# Patient Record
Sex: Male | Born: 1975 | Race: White | Hispanic: No | Marital: Married | Smoking: Never smoker
Health system: Southern US, Community
[De-identification: ages and names within clinical notes are randomized; demographics above are authoritative.]

## PROBLEM LIST (undated history)

## (undated) DIAGNOSIS — K219 Gastro-esophageal reflux disease without esophagitis: Secondary | ICD-10-CM

## (undated) DIAGNOSIS — F411 Generalized anxiety disorder: Secondary | ICD-10-CM

## (undated) DIAGNOSIS — E291 Testicular hypofunction: Secondary | ICD-10-CM

## (undated) HISTORY — DX: Testicular hypofunction: E29.1

## (undated) HISTORY — PX: WISDOM TOOTH EXTRACTION: SHX21

## (undated) HISTORY — DX: Generalized anxiety disorder: F41.1

---

## 2007-07-13 ENCOUNTER — Encounter: Admission: RE | Admit: 2007-07-13 | Discharge: 2007-07-13 | Payer: Self-pay | Admitting: Internal Medicine

## 2011-02-13 ENCOUNTER — Ambulatory Visit (INDEPENDENT_AMBULATORY_CARE_PROVIDER_SITE_OTHER): Payer: BC Managed Care – PPO | Admitting: Internal Medicine

## 2011-02-13 ENCOUNTER — Other Ambulatory Visit: Payer: Self-pay | Admitting: Internal Medicine

## 2011-02-13 ENCOUNTER — Encounter: Payer: Self-pay | Admitting: Internal Medicine

## 2011-02-13 ENCOUNTER — Other Ambulatory Visit: Payer: Self-pay | Admitting: *Deleted

## 2011-02-13 DIAGNOSIS — G9332 Myalgic encephalomyelitis/chronic fatigue syndrome: Secondary | ICD-10-CM

## 2011-02-13 DIAGNOSIS — F411 Generalized anxiety disorder: Secondary | ICD-10-CM

## 2011-02-13 DIAGNOSIS — E291 Testicular hypofunction: Secondary | ICD-10-CM

## 2011-02-13 DIAGNOSIS — K219 Gastro-esophageal reflux disease without esophagitis: Secondary | ICD-10-CM | POA: Insufficient documentation

## 2011-02-13 DIAGNOSIS — R5382 Chronic fatigue, unspecified: Secondary | ICD-10-CM

## 2011-02-13 DIAGNOSIS — R1013 Epigastric pain: Secondary | ICD-10-CM

## 2011-02-13 DIAGNOSIS — R002 Palpitations: Secondary | ICD-10-CM

## 2011-02-13 DIAGNOSIS — K3189 Other diseases of stomach and duodenum: Secondary | ICD-10-CM

## 2011-02-13 LAB — HEPATIC FUNCTION PANEL
ALT: 23 U/L (ref 0–53)
AST: 22 U/L (ref 0–37)
Albumin: 4.8 g/dL (ref 3.5–5.2)
Alkaline Phosphatase: 46 U/L (ref 39–117)
Bilirubin, Direct: 0 mg/dL (ref 0.0–0.3)
Total Bilirubin: 0.5 mg/dL (ref 0.3–1.2)
Total Protein: 7.9 g/dL (ref 6.0–8.3)

## 2011-02-13 LAB — CBC WITH DIFFERENTIAL/PLATELET
Basophils Absolute: 0 10*3/uL (ref 0.0–0.1)
Basophils Relative: 0.5 % (ref 0.0–3.0)
Eosinophils Absolute: 0.1 10*3/uL (ref 0.0–0.7)
Eosinophils Relative: 2.1 % (ref 0.0–5.0)
HCT: 43.6 % (ref 39.0–52.0)
Hemoglobin: 14.8 g/dL (ref 13.0–17.0)
Lymphocytes Relative: 38.3 % (ref 12.0–46.0)
Lymphs Abs: 2.4 10*3/uL (ref 0.7–4.0)
MCHC: 34 g/dL (ref 30.0–36.0)
MCV: 89.5 fl (ref 78.0–100.0)
Monocytes Absolute: 0.5 10*3/uL (ref 0.1–1.0)
Monocytes Relative: 8 % (ref 3.0–12.0)
Neutro Abs: 3.1 10*3/uL (ref 1.4–7.7)
Neutrophils Relative %: 51.1 % (ref 43.0–77.0)
Platelets: 211 10*3/uL (ref 150.0–400.0)
RBC: 4.87 Mil/uL (ref 4.22–5.81)
RDW: 13 % (ref 11.5–14.6)
WBC: 6.2 10*3/uL (ref 4.5–10.5)

## 2011-02-13 LAB — BASIC METABOLIC PANEL
BUN: 11 mg/dL (ref 6–23)
CO2: 29 mEq/L (ref 19–32)
Calcium: 9.6 mg/dL (ref 8.4–10.5)
Chloride: 103 mEq/L (ref 96–112)
Creatinine, Ser: 0.9 mg/dL (ref 0.4–1.5)
GFR: 98.79 mL/min (ref >60.00)
Glucose, Bld: 87 mg/dL (ref 70–99)
Potassium: 4.2 mEq/L (ref 3.5–5.1)
Sodium: 140 mEq/L (ref 135–145)

## 2011-02-13 LAB — T4, FREE: Free T4: 0.81 ng/dL (ref 0.60–1.60)

## 2011-02-13 LAB — TSH: TSH: 1.78 u[IU]/mL (ref 0.35–5.50)

## 2011-02-13 LAB — SEDIMENTATION RATE: Sed Rate: 6 mm/hr (ref 0–22)

## 2011-02-13 MED ORDER — FLUOXETINE HCL 20 MG PO TABS: 20.0000 mg | ORAL_TABLET | Freq: Every day | ORAL | Status: DC

## 2011-02-13 NOTE — Assessment & Plan Note (Signed)
35 year old with history of generalized anxiety disorder. Rule out underlying metabolic abnormality. Obtain thyroid function studies. Consider check serum free metanephrines

## 2011-02-13 NOTE — Assessment & Plan Note (Signed)
Pt with hx of possible hypogonadism.  Repeat testosterone levels

## 2011-02-13 NOTE — Patient Instructions (Signed)
Please forward a copy of your sleep study to our office.

## 2011-02-13 NOTE — Progress Notes (Signed)
Subjective:    Patient ID: Thomas Duran, male    DOB: Mar 29, 1976, 35 y.o.   MRN: [id number]  HPI  35 year old white male to establish. Patient has not had PCP in the past. He has been followed by Dr. Merla Riches at local urgent care. He has been struggling with anxiety disorder for the last several years. He has tried multiple medications and is currently on citalopram 40 mg once daily. Despite taking his SSRI he still has persistent symptoms. He describes intermittent tachycardia, hot flashes and a fluttering sensation in the stomach.  He does admit to being a worrier growing up. He denies history of serious depression or trauma. Denies history of sexual abuse or physical abuse.  He tried taking alprazolam in the past as needed.  It did not help.  Patient also complains of chronic fatigue since 2005. This has been  worked up by his previous primary care physicians. Patient noted to have mildly low testosterone levels in 2010. He was treated with low-dose testosterone replacement for 4-6 months then discontinued. There was question of possible sleep apnea but sleep study was negative.  He complains of eye lids being heavy all the time despite getting enough sleep. It does not make a difference whether he is at work or on vacation he stills feels the same level of fatigue. There was no preceding viral illness. However he did have some life stressors between 2004 2005. During that year he got married, experienced a change in work environment, and found that his brother was diagnosed with schizophrenia.   Review of Systems    Constitutional: Negative for activity change, appetite change and unexpected weight change.  Eyes: Negative for visual disturbance.  Respiratory: Negative for cough, chest tightness and shortness of breath.   Cardiovascular: Negative for chest pain.  Genitourinary: Negative for difficulty urinating.  Neurological: Negative for headaches.  Gastrointestinal: he complains of  chronic reflux. He also has a fluttering sensation in his stomach, "my stomach can't relax"  Psych: Negative for depression  Endo:  No polyuria or polydypsia  Musculoskeletal: Negative for joint pain or swelling Skin: Negative for rash    Patient denies family history of autoimmune disease premature coronary disease, cancer   Past Medical History  Diagnosis Date  . Generalized anxiety disorder     History   Social History  . Marital Status: Married    Spouse Name: N/A    Number of Children: N/A  . Years of Education: N/A   Occupational History  .      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 Smoking status: Never Smoker   . Smokeless tobacco: Not on file  . Alcohol Use: Yes  . Drug Use: No  . Sexually Active: Not on file   Other Topics Concern  . Not on file   Social History Narrative  . No narrative on file    No past surgical history on file.  Family History  Problem Relation Age of Onset  . Schizophrenia Brother     No Known Allergies  No current outpatient prescriptions on file prior to visit.    BP 122/82  Pulse 80  Temp(Src) 98.4 F (36.9 C) (Oral)  Ht 6' (1.829 m)  Wt 194 lb (87.998 kg)  BMI 26.31 kg/m2      Objective:   Physical Exam   Constitutional: Appears well-developed and well-nourished. No distress.  Head: Normocephalic and atraumatic.  Ear:  Normal bilaterally Mouth/Throat: Oropharynx is  clear and moist.  Eyes: Conjunctivae are normal. Pupils are equal, round, and reactive to light.  Neck: Normal range of motion. Neck supple. No thyromegaly present. No carotid bruit Cardiovascular: Normal rate, regular rhythm and normal heart sounds.  Exam reveals no gallop and no friction rub.  No murmur heard. Pulmonary/Chest: Effort normal and breath sounds normal.  No wheezes. No rales.  Abdominal: Soft. Bowel sounds are normal. No mass. There is no tenderness.  Neurological: Alert. No cranial nerve deficit.    Skin: Skin is warm and dry.  Psychiatric: Normal mood and affect. Behavior is normal.       Assessment & Plan:

## 2011-02-13 NOTE — Assessment & Plan Note (Signed)
The patient describes signs and symptoms consistent with chronic fatigue syndrome for the last 5 years. Rule out metabolic disorder.  We discussed further testing if initial basic workup is negative. Patient reports previously the study was normal  -  obtained copy to rule out periodic limb movement disorder.

## 2011-02-13 NOTE — Assessment & Plan Note (Signed)
Pt with chronic dyspepsia.  Previous EGD in April 2011 reported normal.  Consider GI referral

## 2011-02-14 LAB — ANA: Anti Nuclear Antibody(ANA): NEGATIVE

## 2011-02-15 LAB — TESTOSTERONE, FREE, TOTAL, SHBG
Sex Hormone Binding: 23 nmol/L (ref 13–71)
Testosterone, Free: 44.3 pg/mL — ABNORMAL LOW (ref 47.0–244.0)
Testosterone-% Free: 2.3 % (ref 1.6–2.9)
Testosterone: 192.13 ng/dL — ABNORMAL LOW (ref 250–890)

## 2011-02-19 LAB — LUTEINIZING HORMONE: LH: 2.1 m[IU]/mL (ref 1.5–9.3)

## 2011-02-19 LAB — PROLACTIN: Prolactin: 10.8 ng/mL (ref 2.1–17.1)

## 2011-02-20 NOTE — Progress Notes (Signed)
Addended by: Simeon Craft on: 02/20/2011 09:47 AM   Modules accepted: Orders

## 2011-02-20 NOTE — Assessment & Plan Note (Signed)
Pt with low testosterone level but normal LH.  Prolactin level normal.  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   Refer to endo for further evaluation and treatment.

## 2011-02-21 ENCOUNTER — Other Ambulatory Visit: Payer: Self-pay | Admitting: Internal Medicine

## 2011-02-21 DIAGNOSIS — E291 Testicular hypofunction: Secondary | ICD-10-CM

## 2011-03-11 ENCOUNTER — Encounter: Payer: Self-pay | Admitting: Internal Medicine

## 2011-03-11 ENCOUNTER — Ambulatory Visit (INDEPENDENT_AMBULATORY_CARE_PROVIDER_SITE_OTHER): Payer: BC Managed Care – PPO | Admitting: Internal Medicine

## 2011-03-11 VITALS — BP 122/74 | Temp 99.0°F | Ht 73.0 in | Wt 197.0 lb

## 2011-03-11 DIAGNOSIS — L0202 Furuncle of face: Secondary | ICD-10-CM

## 2011-03-11 DIAGNOSIS — L0203 Carbuncle of face: Secondary | ICD-10-CM

## 2011-03-11 MED ORDER — SULFAMETHOXAZOLE-TMP DS 800-160 MG PO TABS: 1.0000 | ORAL_TABLET | Freq: Two times a day (BID) | ORAL | Status: AC

## 2011-03-11 NOTE — Assessment & Plan Note (Signed)
35 year old with 2-3 cm indurated area of right lower face. He has younger daughter with history of MRSA. Treat with Bactrim DS twice a day x10 days. Patient also advised to use warm compress to 3 times per day. He understands area may need incision and drainage. Reassess in one week.

## 2011-03-11 NOTE — Progress Notes (Signed)
  Subjective:    Patient ID: Thomas Duran, male    DOB: 1975-05-16, 35 y.o.   MRN: [id number]  HPI  35 year old white male complains of right lower facial swelling and redness.  His symptoms started approximately 3 days ago. He has associated mild tenderness. He reports his daughter recently diagnosed with MRSA.  He denies any dental abscess, he was recently seen by his dentist.  Review of Systems Negative for fever,  Negative for oral lesions    Past Medical History  Diagnosis Date  . Generalized anxiety disorder     History   Social History  . Marital Status: Married    Spouse Name: N/A    Number of Children: N/A  . Years of Education: N/A   Occupational History  .      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 Smoking status: Never Smoker   . Smokeless tobacco: Not on file  . Alcohol Use: Yes  . Drug Use: No  . Sexually Active: Not on file   Other Topics Concern  . Not on file   Social History Narrative  . No narrative on file    No past surgical history on file.  Family History  Problem Relation Age of Onset  . Schizophrenia Brother     No Known Allergies  Current Outpatient Prescriptions on File Prior to Visit  Medication Sig Dispense Refill  . esomeprazole (NEXIUM) 40 MG capsule Take 40 mg by mouth daily before breakfast.        . FLUoxetine (PROZAC) 20 MG tablet Take 1 tablet (20 mg total) by mouth daily.  30 tablet  2  . Multiple Vitamin (MULTIVITAMIN) tablet Take 1 tablet by mouth daily.          BP 122/74  Temp(Src) 99 F (37.2 C) (Oral)  Ht 6\' 1"  (1.854 m)  Wt 197 lb (89.359 kg)  BMI 25.99 kg/m2    Objective:   Physical Exam  Constitutional: He appears well-developed and well-nourished.  HENT:  Head: Normocephalic and atraumatic.  Mouth/Throat: Oropharynx is clear and moist.  Neck: Normal range of motion. Neck supple.  Cardiovascular: Normal rate, regular rhythm and normal heart sounds.     Pulmonary/Chest: Effort normal and breath sounds normal. No respiratory distress. He has no wheezes.  Lymphadenopathy:    He has no cervical adenopathy.  Skin:       Right lower facial swelling and redness.  Mild tenderness.  No fluctuance       Assessment & Plan:

## 2011-03-11 NOTE — Patient Instructions (Signed)
Apply warm compress to face 2-3 times per day. Please call our office immediately, if your facial abscess / redness gets worse.

## 2011-03-14 ENCOUNTER — Encounter: Payer: Self-pay | Admitting: Internal Medicine

## 2011-03-14 ENCOUNTER — Ambulatory Visit (INDEPENDENT_AMBULATORY_CARE_PROVIDER_SITE_OTHER): Payer: BC Managed Care – PPO | Admitting: Internal Medicine

## 2011-03-14 VITALS — BP 108/70 | HR 66 | Temp 98.2°F | Wt 197.0 lb

## 2011-03-14 DIAGNOSIS — L0201 Cutaneous abscess of face: Secondary | ICD-10-CM

## 2011-03-14 DIAGNOSIS — L0202 Furuncle of face: Secondary | ICD-10-CM

## 2011-03-14 DIAGNOSIS — L0203 Carbuncle of face: Secondary | ICD-10-CM

## 2011-03-14 DIAGNOSIS — L03211 Cellulitis of face: Secondary | ICD-10-CM

## 2011-03-14 NOTE — Progress Notes (Signed)
  Subjective:    Patient ID: Thomas Duran, male    DOB: Aug 16, 1975, 35 y.o.   MRN: [id number]  HPI  35 year old patient who is seen today in followup. He was seen here 3 days ago and placed on Bactrim DS for a facial cellulitis with furuncle.  His daughter has been treated in the recent past for MRSA.  He continues to have considerable right facial swelling with discomfort there has been no fever or chills. He feels he is unchanged without improvement but has not developed any systemic toxicity symptoms.    Review of Systems  Constitutional: Negative for fever, chills and fatigue.  HENT: Positive for facial swelling.        Objective:   Physical Exam  Constitutional: He is oriented to person, place, and time. He appears well-developed and well-nourished. No distress.       Afebrile. Non-toxic-appearing. Pulse rate 66  HENT:  Head: Normocephalic.  Right Ear: External ear normal.  Left Ear: External ear normal.       Considerable right facial swelling approximately 6-8 cm in diameter; it is firm tender mildly red but not fluctuant; No drainage  Eyes: Conjunctivae and EOM are normal.  Neck: Normal range of motion.  Cardiovascular: Normal rate and normal heart sounds.   Pulmonary/Chest: Breath sounds normal.  Abdominal: Bowel sounds are normal.  Musculoskeletal: Normal range of motion. He exhibits no edema and no tenderness.  Neurological: He is alert and oriented to person, place, and time.  Psychiatric: He has a normal mood and affect. His behavior is normal.          Assessment & Plan:   Persistent facial cellulitis/carbuncle-  will set up ENT evaluation for consideration of I&D

## 2011-03-14 NOTE — Patient Instructions (Addendum)
Warm compresses 4 times daily ENT evaluation tomorrow as scheduled  Take your antibiotic as prescribed until ALL of it is gone, but stop if you develop a rash, swelling, or any side effects of the medication.  Contact our office as soon as possible if  there are side effects of the medication.

## 2011-03-18 ENCOUNTER — Ambulatory Visit: Payer: BC Managed Care – PPO | Admitting: Internal Medicine

## 2011-03-29 ENCOUNTER — Ambulatory Visit (INDEPENDENT_AMBULATORY_CARE_PROVIDER_SITE_OTHER): Payer: BC Managed Care – PPO | Admitting: Internal Medicine

## 2011-03-29 DIAGNOSIS — J069 Acute upper respiratory infection, unspecified: Secondary | ICD-10-CM

## 2011-03-29 DIAGNOSIS — A4902 Methicillin resistant Staphylococcus aureus infection, unspecified site: Secondary | ICD-10-CM | POA: Insufficient documentation

## 2011-03-29 NOTE — Patient Instructions (Signed)
Use neil med sinus over the counter as directed Use Mucinex DM for cough

## 2011-03-29 NOTE — Progress Notes (Signed)
  Subjective:    Patient ID: Thomas Duran, male    DOB: [birth year], 35 y.o.   MRN: [id number]  URI  This is a new problem. The current episode started in the past 7 days. The problem has been unchanged. There has been no fever. Associated symptoms include congestion and coughing. Pertinent negatives include no neck pain, sinus pain or sore throat.    He was seen by ENT for right facial abscess.  Positive culture for MRSA.  He recently finished course of doxycycline.  Review of Systems  HENT: Positive for congestion. Negative for sore throat and neck pain.   Respiratory: Positive for cough.    Past Medical History  Diagnosis Date  . Generalized anxiety disorder     History   Social History  . Marital Status: Married    Spouse Name: N/A    Number of Children: N/A  . Years of Education: N/A   Occupational History  .      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 Smoking status: Never Smoker   . Smokeless tobacco: Not on file  . Alcohol Use: Yes  . Drug Use: No  . Sexually Active: Not on file   Other Topics Concern  . Not on file   Social History Narrative  . No narrative on file    No past surgical history on file.  Family History  Problem Relation Age of Onset  . Schizophrenia Brother     No Known Allergies  Current Outpatient Prescriptions on File Prior to Visit  Medication Sig Dispense Refill  . esomeprazole (NEXIUM) 40 MG capsule Take 40 mg by mouth daily before breakfast.        . FLUoxetine (PROZAC) 20 MG tablet Take 1 tablet (20 mg total) by mouth daily.  30 tablet  2  . Multiple Vitamin (MULTIVITAMIN) tablet Take 1 tablet by mouth daily.          BP 112/82  Temp(Src) 98.2 F (36.8 C) (Oral)  Wt 194 lb (87.998 kg)      Objective:   Physical Exam  Constitutional: He appears well-developed and well-nourished.  HENT:  Head: Normocephalic and atraumatic.  Right Ear: External ear normal.  Left Ear: External ear  normal.  Mouth/Throat: No oropharyngeal exudate.       Mild oropharyngeal erythema, signs of postnasal drip  Cardiovascular: Normal rate, regular rhythm and normal heart sounds.   Pulmonary/Chest: Effort normal and breath sounds normal. No respiratory distress. He has no wheezes. He has no rales.      Assessment & Plan:

## 2011-03-29 NOTE — Assessment & Plan Note (Signed)
Patient was seen by ENT-Dr. Annalee Genta. Cultures confirmed MRSA. He was treated with clindamycin and doxycycline. I&D performed successfully.

## 2011-03-29 NOTE — Assessment & Plan Note (Signed)
Nonproductive cough consistent with a viral URI. He is afebrile and his chest is clear on exam.  I suspect cough from post nasal gtt. We discussed symptomatic treatment.  Patient advised to call office if symptoms persist or worsen.

## 2011-04-22 ENCOUNTER — Inpatient Hospital Stay: Admission: RE | Admit: 2011-04-22 | Payer: BC Managed Care – PPO | Source: Ambulatory Visit

## 2011-04-22 ENCOUNTER — Other Ambulatory Visit: Payer: Self-pay | Admitting: Internal Medicine

## 2011-04-22 DIAGNOSIS — E291 Testicular hypofunction: Secondary | ICD-10-CM

## 2011-04-22 DIAGNOSIS — Z139 Encounter for screening, unspecified: Secondary | ICD-10-CM

## 2011-04-26 ENCOUNTER — Ambulatory Visit
Admission: RE | Admit: 2011-04-26 | Discharge: 2011-04-26 | Disposition: A | Discharge status: Home / Self Care | Payer: BC Managed Care – PPO | Source: Ambulatory Visit | Attending: Internal Medicine | Admitting: Internal Medicine

## 2011-04-26 DIAGNOSIS — E291 Testicular hypofunction: Secondary | ICD-10-CM

## 2011-04-26 DIAGNOSIS — Z139 Encounter for screening, unspecified: Secondary | ICD-10-CM

## 2011-05-14 ENCOUNTER — Other Ambulatory Visit: Payer: Self-pay | Admitting: *Deleted

## 2011-05-14 MED ORDER — FLUOXETINE HCL 20 MG PO TABS: 20.0000 mg | ORAL_TABLET | Freq: Every day | ORAL | Status: DC

## 2011-05-28 ENCOUNTER — Other Ambulatory Visit: Payer: Self-pay | Admitting: Physician Assistant

## 2011-05-28 MED ORDER — ESOMEPRAZOLE MAGNESIUM 40 MG PO CPDR: 40.0000 mg | DELAYED_RELEASE_CAPSULE | Freq: Every day | ORAL | Status: DC

## 2011-08-14 ENCOUNTER — Ambulatory Visit (INDEPENDENT_AMBULATORY_CARE_PROVIDER_SITE_OTHER): Payer: BC Managed Care – PPO | Admitting: Internal Medicine

## 2011-08-14 ENCOUNTER — Encounter: Payer: Self-pay | Admitting: Internal Medicine

## 2011-08-14 VITALS — BP 104/66 | HR 76 | Temp 98.2°F | Ht 73.0 in | Wt 200.0 lb

## 2011-08-14 DIAGNOSIS — L0202 Furuncle of face: Secondary | ICD-10-CM

## 2011-08-14 MED ORDER — SULFAMETHOXAZOLE-TMP DS 800-160 MG PO TABS: 1.0000 | ORAL_TABLET | Freq: Two times a day (BID) | ORAL | Status: AC

## 2011-08-14 NOTE — Patient Instructions (Signed)
Please call our office if your symptoms do not improve or gets worse.  

## 2011-08-14 NOTE — Progress Notes (Signed)
  Subjective:    Patient ID: Thomas Duran, male    DOB: [birth year], 36 y.o.   MRN: [id number]  HPI   36 year old white male with history of MRSA infection of the face presents with small lesion right lower cheek.  Onset 24-48 hours. Patient was previously seen by ENT and  had facial abscess drained.  Both of his children have hx of MRSA skin infection as well.  1 year daughter was first to get MRSA.   Review of Systems Negative for fever or chills   Past Medical History  Diagnosis Date  . Generalized anxiety disorder     History   Social History  . Marital Status: Married    Spouse Name: N/A    Number of Children: N/A  . Years of Education: N/A   Occupational History  .      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 Smoking status: Never Smoker   . Smokeless tobacco: Not on file  . Alcohol Use: Yes  . Drug Use: No  . Sexually Active: Not on file   Other Topics Concern  . Not on file   Social History Narrative  . No narrative on file    No past surgical history on file.  Family History  Problem Relation Age of Onset  . Schizophrenia Brother     No Known Allergies  Current Outpatient Prescriptions on File Prior to Visit  Medication Sig Dispense Refill  . esomeprazole (NEXIUM) 40 MG capsule Take 1 capsule (40 mg total) by mouth daily.  30 capsule  3  . Multiple Vitamin (MULTIVITAMIN) tablet Take 1 tablet by mouth daily.          BP 104/66  Pulse 76  Temp(Src) 98.2 F (36.8 C) (Oral)  Ht 6\' 1"  (1.854 m)  Wt 200 lb (90.719 kg)  BMI 26.39 kg/m2       Objective:   Physical Exam  Constitutional: He appears well-developed and well-nourished.  Cardiovascular: Normal rate, regular rhythm and normal heart sounds.   Pulmonary/Chest: Effort normal and breath sounds normal. He has no wheezes.  Skin:       Small slightly erythematous subcentimeter lesion right lower cheek.      Assessment & Plan:

## 2011-08-14 NOTE — Assessment & Plan Note (Signed)
Previous facial abscess required surgical drainage.  He has new small area on right cheek.  Start Bactrim DS.  Patient advised to call office if symptoms persist or worsen.

## 2011-09-21 ENCOUNTER — Other Ambulatory Visit: Payer: Self-pay | Admitting: Physician Assistant

## 2011-11-08 ENCOUNTER — Ambulatory Visit (INDEPENDENT_AMBULATORY_CARE_PROVIDER_SITE_OTHER): Payer: BC Managed Care – PPO | Admitting: Internal Medicine

## 2011-11-08 ENCOUNTER — Encounter: Payer: Self-pay | Admitting: Internal Medicine

## 2011-11-08 VITALS — BP 116/82 | HR 71 | Temp 98.6°F | Wt 200.0 lb

## 2011-11-08 DIAGNOSIS — R0683 Snoring: Secondary | ICD-10-CM

## 2011-11-08 DIAGNOSIS — R0989 Other specified symptoms and signs involving the circulatory and respiratory systems: Secondary | ICD-10-CM

## 2011-11-08 DIAGNOSIS — E291 Testicular hypofunction: Secondary | ICD-10-CM

## 2011-11-08 DIAGNOSIS — R0609 Other forms of dyspnea: Secondary | ICD-10-CM

## 2011-11-08 DIAGNOSIS — G4733 Obstructive sleep apnea (adult) (pediatric): Secondary | ICD-10-CM | POA: Insufficient documentation

## 2011-11-08 DIAGNOSIS — F411 Generalized anxiety disorder: Secondary | ICD-10-CM

## 2011-11-08 NOTE — Assessment & Plan Note (Signed)
Patient tried to taper off fluoxetine 30 mg in his own. He did well for one to 2 weeks but anxiety symptoms returned. He has resumed 30 mg of fluoxetine. I recommend he continue for the next 6 months or year. If he would like to try tapering off in the future, we discussed lowering dose slowly over several months.

## 2011-11-08 NOTE — Assessment & Plan Note (Signed)
36 year old with loud snoring over last 3 months. Wife concerned of possible obstructive sleep apnea. Arrange in-home sleep study.

## 2011-11-08 NOTE — Patient Instructions (Addendum)
Our office will contact you re: arranging home sleep study

## 2011-11-08 NOTE — Progress Notes (Signed)
  Subjective:    Patient ID: Thomas Duran, male    DOB: [birth year], 36 y.o.   MRN: [id number]  HPI  36 year old white male with history of hypogonadism and chronic fatigue syndrome for followup. Since previous visit he was seen by endocrinologist. He was started on testosterone replacement. He reports testosterone levels have normalized. He is feeling somewhat better.  His wife has noticed increased snoring over last 3 months.  He does experience daytime somnolence.  Generalized anxiety disorder-patient tried to taper off fluoxetine. He was okay for approximately 2 weeks but anxiety symptoms return. Patient resumed 30 mg of fluoxetine.  Review of Systems Negative for chest pain  Past Medical History  Diagnosis Date  . Generalized anxiety disorder     History   Social History  . Marital Status: Married    Spouse Name: N/A    Number of Children: N/A  . Years of Education: N/A   Occupational History  .      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 Smoking status: Never Smoker   . Smokeless tobacco: Not on file  . Alcohol Use: Yes  . Drug Use: No  . Sexually Active: Not on file   Other Topics Concern  . Not on file   Social History Narrative  . No narrative on file    No past surgical history on file.  Family History  Problem Relation Age of Onset  . Schizophrenia Brother     No Known Allergies  Current Outpatient Prescriptions on File Prior to Visit  Medication Sig Dispense Refill  . Multiple Vitamin (MULTIVITAMIN) tablet Take 1 tablet by mouth daily.        Marland Kitchen NEXIUM 40 MG capsule TAKE 1 CAPSULE BY MOUTH DAILY  30 capsule  3  . testosterone cypionate (DEPOTESTOTERONE CYPIONATE) 200 MG/ML injection Inject 200 mg into the muscle every 14 (fourteen) days.        BP 116/82  Pulse 71  Temp 98.6 F (37 C) (Oral)  Wt 200 lb (90.719 kg)       Objective:   Physical Exam  Constitutional: He appears well-developed and  well-nourished.  HENT:  Head: Normocephalic and atraumatic.       Mildly crowded oropharynx  Neck: Neck supple.  Cardiovascular: Normal rate, regular rhythm and normal heart sounds.   Pulmonary/Chest: Effort normal and breath sounds normal. He has no wheezes.  Lymphadenopathy:    He has no cervical adenopathy.  Psychiatric: He has a normal mood and affect. His behavior is normal.      Assessment & Plan:

## 2011-11-08 NOTE — Assessment & Plan Note (Signed)
Seen by endocrinologist.  He is on testosterone injections.  He reports testosterone levels are now normal. Management as per endocrinologist.

## 2011-11-13 ENCOUNTER — Other Ambulatory Visit: Payer: Self-pay | Admitting: *Deleted

## 2011-11-13 MED ORDER — FLUOXETINE HCL 20 MG PO TABS: 20.0000 mg | ORAL_TABLET | Freq: Every day | ORAL | Status: DC

## 2011-11-21 ENCOUNTER — Ambulatory Visit (INDEPENDENT_AMBULATORY_CARE_PROVIDER_SITE_OTHER): Payer: BC Managed Care – PPO | Admitting: Pulmonary Disease

## 2011-11-21 DIAGNOSIS — G4733 Obstructive sleep apnea (adult) (pediatric): Secondary | ICD-10-CM

## 2011-11-21 DIAGNOSIS — R0683 Snoring: Secondary | ICD-10-CM

## 2011-11-28 ENCOUNTER — Ambulatory Visit (INDEPENDENT_AMBULATORY_CARE_PROVIDER_SITE_OTHER): Payer: BC Managed Care – PPO | Admitting: Internal Medicine

## 2011-11-28 ENCOUNTER — Encounter: Payer: Self-pay | Admitting: Internal Medicine

## 2011-11-28 VITALS — BP 122/82 | Temp 98.3°F | Wt 199.0 lb

## 2011-11-28 DIAGNOSIS — G4733 Obstructive sleep apnea (adult) (pediatric): Secondary | ICD-10-CM

## 2011-11-28 NOTE — Assessment & Plan Note (Signed)
Patient has mild obstructive sleep apnea. We discussed various treatment options. He elects to try oral appliance and weight loss.

## 2011-11-28 NOTE — Progress Notes (Signed)
  Subjective:    Patient ID: Thomas Duran, male    DOB: 31-Aug-1975, 36 y.o.   MRN: [id number]  HPI  36 year old white male for followup for possible obstructive sleep apnea. Patient underwent in-home sleep study. It was notable for 83 Events. He had an AHI of 15 per hour and oxygen saturation as low as 89%. Patient reports feeling tired when he wakes up at least to 3 times per week.  Accompanied by daughters [name] and [name].  Review of Systems    No hx of cardiac disease  Past Medical History  Diagnosis Date  . Generalized anxiety disorder     History   Social History  . Marital Status: Married    Spouse Name: N/A    Number of Children: N/A  . Years of Education: N/A   Occupational History  .      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 Smoking status: Never Smoker   . Smokeless tobacco: Not on file  . Alcohol Use: Yes  . Drug Use: No  . Sexually Active: Not on file   Other Topics Concern  . Not on file   Social History Narrative  . No narrative on file    No past surgical history on file.  Family History  Problem Relation Age of Onset  . Schizophrenia Brother     No Known Allergies  Current Outpatient Prescriptions on File Prior to Visit  Medication Sig Dispense Refill  . FLUoxetine (PROZAC) 20 MG tablet Take 1 tablet (20 mg total) by mouth daily.  30 tablet  5  . Multiple Vitamin (MULTIVITAMIN) tablet Take 1 tablet by mouth daily.        Marland Kitchen NEXIUM 40 MG capsule TAKE 1 CAPSULE BY MOUTH DAILY  30 capsule  3  . testosterone cypionate (DEPOTESTOTERONE CYPIONATE) 200 MG/ML injection Inject 200 mg into the muscle every 14 (fourteen) days.        BP 122/82  Temp 98.3 F (36.8 C) (Oral)  Wt 199 lb (90.266 kg)    Objective:   Physical Exam  Constitutional: He is oriented to person, place, and time. He appears well-developed and well-nourished.  Cardiovascular: Normal rate, regular rhythm and normal heart sounds.     Pulmonary/Chest: Effort normal and breath sounds normal.  Neurological: He is oriented to person, place, and time.  Psychiatric: He has a normal mood and affect. His behavior is normal.          Assessment & Plan:

## 2011-12-05 ENCOUNTER — Ambulatory Visit: Payer: BC Managed Care – PPO | Admitting: Internal Medicine

## 2012-01-22 ENCOUNTER — Other Ambulatory Visit: Payer: Self-pay | Admitting: Physician Assistant

## 2012-01-22 NOTE — Telephone Encounter (Signed)
Chart pulled to PA pool at nurses station DOS 12/25/10

## 2012-01-24 ENCOUNTER — Other Ambulatory Visit: Payer: Self-pay | Admitting: Physician Assistant

## 2012-01-24 NOTE — Telephone Encounter (Signed)
Patients chart is at the nurses station in the pa pool pile.  [id number]

## 2012-01-28 ENCOUNTER — Other Ambulatory Visit: Payer: Self-pay | Admitting: Physician Assistant

## 2012-02-05 ENCOUNTER — Encounter: Payer: Self-pay | Admitting: Internal Medicine

## 2012-02-05 ENCOUNTER — Ambulatory Visit (INDEPENDENT_AMBULATORY_CARE_PROVIDER_SITE_OTHER): Payer: BC Managed Care – PPO | Admitting: Internal Medicine

## 2012-02-05 VITALS — BP 110/80 | HR 64 | Temp 98.6°F | Wt 200.0 lb

## 2012-02-05 DIAGNOSIS — K219 Gastro-esophageal reflux disease without esophagitis: Secondary | ICD-10-CM

## 2012-02-05 DIAGNOSIS — F411 Generalized anxiety disorder: Secondary | ICD-10-CM

## 2012-02-05 MED ORDER — FLUOXETINE HCL 20 MG PO TABS: 20.0000 mg | ORAL_TABLET | Freq: Every day | ORAL | Status: DC

## 2012-02-05 MED ORDER — ESOMEPRAZOLE MAGNESIUM 40 MG PO CPDR: 40.0000 mg | DELAYED_RELEASE_CAPSULE | Freq: Every day | ORAL | Status: DC

## 2012-02-05 NOTE — Assessment & Plan Note (Signed)
Patient initially diagnosed with gastroesophageal reflux disease in 2001. Patient had EGD in [place] 5 years ago. It was negative for Barrett esophagus or H. Pylori. He is having rebound symptoms after running out of Nexium. Refill PPI for 1 to 2 months. Patient instructed to transition to ranitidine 150 mg twice daily. Antireflux handout provided. Patient will contact our office if he has significant exacerbation of reflux symptoms.

## 2012-02-05 NOTE — Progress Notes (Signed)
  Subjective:    Patient ID: Thomas Duran, male    DOB: 1975-05-30, 36 y.o.   MRN: [id number]  HPI  36 year old white male with gastroesophageal reflux disease, generalized anxiety disorder and sleep apnea for follow up. Patient reports history of GERD initially diagnosed in 2001-2002. EGD was performed while he was living in [place]. He is had repeat EGD 5 years ago in [place]. Patient reports EGD was negative for Barrett's esophagus and also H. Pylori. He ran out of Nexium couple weeks ago and is having rebound reflux symptoms.   Review of Systems Negative for dysphasia  Past Medical History  Diagnosis Date  . Generalized anxiety disorder     History   Social History  . Marital Status: Married    Spouse Name: N/A    Number of Children: N/A  . Years of Education: N/A   Occupational History  .      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 Smoking status: Never Smoker   . Smokeless tobacco: Not on file  . Alcohol Use: Yes  . Drug Use: No  . Sexually Active: Not on file   Other Topics Concern  . Not on file   Social History Narrative  . No narrative on file    No past surgical history on file.  Family History  Problem Relation Age of Onset  . Schizophrenia Brother     No Known Allergies  Current Outpatient Prescriptions on File Prior to Visit  Medication Sig Dispense Refill  . FLUoxetine (PROZAC) 20 MG tablet Take 1 tablet (20 mg total) by mouth daily.  30 tablet  5  . Multiple Vitamin (MULTIVITAMIN) tablet Take 1 tablet by mouth daily.        Marland Kitchen testosterone cypionate (DEPOTESTOTERONE CYPIONATE) 200 MG/ML injection Inject 200 mg into the muscle every 14 (fourteen) days.      Marland Kitchen DISCONTD: NEXIUM 40 MG capsule TAKE 1 CAPSULE BY MOUTH DAILY  30 capsule  3    BP 110/80  Pulse 64  Temp 98.6 F (37 C) (Oral)  Wt 200 lb (90.719 kg)       Objective:   Physical Exam  Constitutional: He is oriented to person, place, and time. He  appears well-developed and well-nourished.  Cardiovascular: Normal rate, regular rhythm and normal heart sounds.   Pulmonary/Chest: Effort normal and breath sounds normal. He has no wheezes.  Neurological: He is alert and oriented to person, place, and time.  Psychiatric: He has a normal mood and affect. His behavior is normal.          Assessment & Plan:

## 2012-02-05 NOTE — Patient Instructions (Addendum)
After you stop Nexium, take ranitidine 150 mg twice daily for 1 to 2 weeks.  You can use ranitidine 150 mg twice daily as needed. See antireflux diet. Goal weight loss-at least 10 pounds before next office visit

## 2012-02-05 NOTE — Assessment & Plan Note (Signed)
Continue same dose of fluoxetine.  

## 2012-09-03 ENCOUNTER — Other Ambulatory Visit: Payer: Self-pay | Admitting: Internal Medicine

## 2012-11-12 ENCOUNTER — Ambulatory Visit (INDEPENDENT_AMBULATORY_CARE_PROVIDER_SITE_OTHER): Payer: BC Managed Care – PPO | Admitting: Internal Medicine

## 2012-11-12 ENCOUNTER — Encounter: Payer: Self-pay | Admitting: Internal Medicine

## 2012-11-12 VITALS — BP 108/84 | Temp 98.5°F | Wt 193.0 lb

## 2012-11-12 DIAGNOSIS — H9319 Tinnitus, unspecified ear: Secondary | ICD-10-CM | POA: Insufficient documentation

## 2012-11-12 DIAGNOSIS — H9311 Tinnitus, right ear: Secondary | ICD-10-CM

## 2012-11-12 MED ORDER — FLUTICASONE PROPIONATE 50 MCG/ACT NA SUSP: 2.0000 | Freq: Every day | NASAL | Status: DC

## 2012-11-12 NOTE — Assessment & Plan Note (Signed)
37 year old white male with new onset right ear tinnitus. No worrisome history or exam findings suggestive of vascular tinnitus. His symptoms may be secondary to eustachian tube dysfunction. Trial of intranasal steroids. If symptoms do not improve within 2 weeks, we discussed referral to ENT.

## 2012-11-12 NOTE — Progress Notes (Signed)
  Subjective:    Patient ID: Thomas Duran, male    DOB: 12/04/1975, 37 y.o.   MRN: [id number]  HPI  37 year old white male with history of gastroesophageal reflux disease and generalized anxiety disorder complains of tinnitus of the right ear. His symptoms started 2 days ago. No specific trigger. He noticed symptoms while he was driving to [place] for work. He describes tinnitus as "dial tone sound". There are no fluctuations. There is no pulsatile nature.  Ringing is constant.  He denies any recent illness or upper respiratory infection. He has noticed occasional clicking sensation with swallowing. He denies any  hearing loss.  He denies exposure to loud noises.   Review of Systems Negative for headaches.  Negative for use of any recent antibiotics or new medications.  Past Medical History  Diagnosis Date  . Generalized anxiety disorder     History   Social History  . Marital Status: Married    Spouse Name: N/A    Number of Children: N/A  . Years of Education: N/A   Occupational History  .      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 Smoking status: Never Smoker   . Smokeless tobacco: Not on file  . Alcohol Use: Yes  . Drug Use: No  . Sexually Active: Not on file   Other Topics Concern  . Not on file   Social History Narrative  . No narrative on file    No past surgical history on file.  Family History  Problem Relation Age of Onset  . Schizophrenia Brother     No Known Allergies  Current Outpatient Prescriptions on File Prior to Visit  Medication Sig Dispense Refill  . FLUoxetine (PROZAC) 20 MG capsule TAKE 1 CAPSULE EVERY DAY  30 capsule  4  . Multiple Vitamin (MULTIVITAMIN) tablet Take 1 tablet by mouth daily.        Marland Kitchen SAFETY-LOK 3CC SYR 22GX1.5" 22G X 1-1/2" 3 ML MISC 1 each Once every 2 weeks.      Marland Kitchen testosterone cypionate (DEPOTESTOTERONE CYPIONATE) 200 MG/ML injection Inject 200 mg into the muscle every 14  (fourteen) days.       No current facility-administered medications on file prior to visit.    BP 108/84  Temp(Src) 98.5 F (36.9 C) (Oral)  Wt 193 lb (87.544 kg)  BMI 25.47 kg/m2       Objective:   Physical Exam  Constitutional: He is oriented to person, place, and time. He appears well-developed and well-nourished.  HENT:  Head: Normocephalic and atraumatic.  Right Ear: External ear normal.  Left Ear: External ear normal.  Mouth/Throat: Oropharynx is clear and moist.  No gross hearing loss.  Normal Rinne and Weber test  Eyes: EOM are normal. Pupils are equal, round, and reactive to light.  Neck: Neck supple.  Lymphadenopathy:    He has no cervical adenopathy.  Neurological: He is alert and oriented to person, place, and time. No cranial nerve deficit.          Assessment & Plan:

## 2012-11-12 NOTE — Patient Instructions (Addendum)
Please contact our office if ringing in your ear does not improve within 2 weeks.

## 2013-01-27 ENCOUNTER — Other Ambulatory Visit: Payer: Self-pay | Admitting: Internal Medicine

## 2013-02-02 ENCOUNTER — Other Ambulatory Visit: Payer: Self-pay | Admitting: Internal Medicine

## 2013-07-31 ENCOUNTER — Other Ambulatory Visit: Payer: Self-pay | Admitting: Internal Medicine

## 2013-09-03 ENCOUNTER — Other Ambulatory Visit: Payer: Self-pay | Admitting: Internal Medicine

## 2013-11-29 ENCOUNTER — Encounter: Payer: Self-pay | Admitting: Family Medicine

## 2013-11-29 ENCOUNTER — Ambulatory Visit (INDEPENDENT_AMBULATORY_CARE_PROVIDER_SITE_OTHER): Payer: BC Managed Care – PPO | Admitting: Family Medicine

## 2013-11-29 VITALS — BP 120/78 | HR 64 | Temp 98.9°F | Ht 73.0 in | Wt 196.0 lb

## 2013-11-29 DIAGNOSIS — J029 Acute pharyngitis, unspecified: Secondary | ICD-10-CM

## 2013-11-29 LAB — POCT RAPID STREP A (OFFICE): Rapid Strep A Screen: NEGATIVE

## 2013-11-29 NOTE — Patient Instructions (Signed)
Ibuprofen scheduled at least every 6-8 hours Sudafed per directions scheduled x 2-3 days Salt water gargles This is likely viral pharyngitis with associated upper respiratory infection. I think sudafed may help decrease pressure in your ears.  If your strep culture is positive, we will call in antibiotics for you.  This should get better in 7-10 days  Pharyngitis Pharyngitis is redness, pain, and swelling (inflammation) of your pharynx.  CAUSES  Pharyngitis is usually caused by infection. Most of the time, these infections are from viruses (viral) and are part of a cold. However, sometimes pharyngitis is caused by bacteria (bacterial). Pharyngitis can also be caused by allergies. Viral pharyngitis may be spread from person to person by coughing, sneezing, and personal items or utensils (cups, forks, spoons, toothbrushes). Bacterial pharyngitis may be spread from person to person by more intimate contact, such as kissing.  SIGNS AND SYMPTOMS  Symptoms of pharyngitis include:   Sore throat.   Tiredness (fatigue).   Low-grade fever.   Headache.  Joint pain and muscle aches.  Skin rashes.  Swollen lymph nodes.  Plaque-like film on throat or tonsils (often seen with bacterial pharyngitis). DIAGNOSIS  Your health care provider will ask you questions about your illness and your symptoms. Your medical history, along with a physical exam, is often all that is needed to diagnose pharyngitis. Sometimes, a rapid strep test is done. Other lab tests may also be done, depending on the suspected cause.  TREATMENT  Viral pharyngitis will usually get better in 3-4 days without the use of medicine. Bacterial pharyngitis is treated with medicines that kill germs (antibiotics).  HOME CARE INSTRUCTIONS   Drink enough water and fluids to keep your urine clear or pale yellow.   Only take over-the-counter or prescription medicines as directed by your health care provider:   If you are prescribed  antibiotics, make sure you finish them even if you start to feel better.   Do not take aspirin.   Get lots of rest.   Gargle with 8 oz of salt water ( tsp of salt per 1 qt of water) as often as every 1-2 hours to soothe your throat.   Throat lozenges (if you are not at risk for choking) or sprays may be used to soothe your throat. SEEK MEDICAL CARE IF:   You have large, tender lumps in your neck.  You have a rash.  You cough up green, yellow-brown, or bloody spit. SEEK IMMEDIATE MEDICAL CARE IF:   Your neck becomes stiff.  You drool or are unable to swallow liquids.  You vomit or are unable to keep medicines or liquids down.  You have severe pain that does not go away with the use of recommended medicines.  You have trouble breathing (not caused by a stuffy nose). MAKE SURE YOU:   Understand these instructions.  Will watch your condition.  Will get help right away if you are not doing well or get worse. Document Released: 04/01/2005 Document Revised: 01/20/2013 Document Reviewed: 12/07/2012 [facility] Patient Information 2015 [place], [place]. This information is not intended to replace advice given to you by your health care provider. Make sure you discuss any questions you have with your health care provider.

## 2013-11-29 NOTE — Progress Notes (Signed)
  Garret Reddish, MD Phone: [phone]  Subjective:   Thomas Duran is a 38 y.o. year old very pleasant male patient who presents with the following:  Sore throat/ear pain X  1 day. Stable since starting yesterday morning throughout the day but worse this morning. Moderate Aching and sharp pain with swallowing mainly on right side of throat. Wants to hold ear to relieve pressure with swallowing. Feels like there may be fluid in his ear. Pain seems to shoot up to the ear. No sick contacts. Denies cough/cold. Mild nose stuffiness. No cough.  ROS-No fever/chills. Some mild aches.   Past Medical History- OSA, GAD, GERD, hypogonadism  Medications- reviewed and updated Current Outpatient Prescriptions  Medication Sig Dispense Refill  . B-D 3CC LUER-LOK SYR 21GX1-1/2 21G X 1-1/2" 3 ML MISC USE TO INJECT TESTOSTERONE ONCE EVERY 2 WEEKS  8 each  1  . FLUoxetine (PROZAC) 20 MG capsule TAKE 1 CAPSULE EVERY DAY  30 capsule  0  . ranitidine (ZANTAC) 150 MG tablet Take 150 mg by mouth 2 (two) times daily.      Marland Kitchen testosterone cypionate (DEPOTESTOTERONE CYPIONATE) 200 MG/ML injection Inject 200 mg into the muscle every 14 (fourteen) days.      . Multiple Vitamin (MULTIVITAMIN) tablet Take 1 tablet by mouth daily.         No current facility-administered medications for this visit.    Objective: BP 120/78  Pulse 64  Temp(Src) 98.9 F (37.2 C)  Ht 6\' 1"  (1.854 m)  Wt 196 lb (88.905 kg)  BMI 25.86 kg/m2 Gen: NAD, resting comfortably HEENT: Pharynx erythematous, mild tonsilar hypertrophy. No exudate. TM normal bilaterally. Purulent discharge in nose.  Neck: tender lymph node noted on right CV: RRR no murmurs rubs or gallops Lungs: CTAB no crackles, wheeze, rhonchi Abdomen: soft/nontender/nondistended/normal bowel sounds.  Skin: warm, dry, no rash  Assessment/Plan:   Viral pharyngitis. URI symptoms with ear pressure Centor 3/5. Rapid strep negative. Sent for culture.  Advised of  symptomatic care (see AVS). Use sudafed to hopefully provide relief of ear discomfort though may be referred to throat.  Given reasons for return   Orders Placed This Encounter  Procedures  . Throat culture [facility])  . POC Rapid Strep A

## 2013-12-01 LAB — CULTURE, GROUP A STREP: Organism ID, Bacteria: NORMAL

## 2014-01-03 ENCOUNTER — Other Ambulatory Visit: Payer: Self-pay | Admitting: Internal Medicine

## 2014-01-07 ENCOUNTER — Other Ambulatory Visit: Payer: Self-pay | Admitting: Internal Medicine

## 2014-02-02 ENCOUNTER — Other Ambulatory Visit: Payer: Self-pay | Admitting: Internal Medicine

## 2014-02-08 ENCOUNTER — Other Ambulatory Visit: Payer: Self-pay | Admitting: Internal Medicine

## 2014-02-08 ENCOUNTER — Telehealth: Payer: Self-pay | Admitting: Internal Medicine

## 2014-02-08 MED ORDER — FLUOXETINE HCL 20 MG PO CAPS: ORAL_CAPSULE | ORAL | Status: DC

## 2014-02-08 NOTE — Telephone Encounter (Signed)
rx sent in electronically 

## 2014-02-08 NOTE — Telephone Encounter (Signed)
Pt has appt sch for 03-14-14 and needs refill on fluoxetine call into [facility]. Pt is out

## 2014-03-11 ENCOUNTER — Other Ambulatory Visit: Payer: Self-pay | Admitting: Internal Medicine

## 2014-03-14 ENCOUNTER — Encounter: Payer: Self-pay | Admitting: Internal Medicine

## 2014-03-14 ENCOUNTER — Ambulatory Visit (INDEPENDENT_AMBULATORY_CARE_PROVIDER_SITE_OTHER): Payer: BC Managed Care – PPO | Admitting: Internal Medicine

## 2014-03-14 VITALS — BP 114/76 | HR 75 | Temp 98.6°F | Ht 73.0 in | Wt 197.0 lb

## 2014-03-14 DIAGNOSIS — F411 Generalized anxiety disorder: Secondary | ICD-10-CM

## 2014-03-14 DIAGNOSIS — R253 Fasciculation: Secondary | ICD-10-CM

## 2014-03-14 DIAGNOSIS — E785 Hyperlipidemia, unspecified: Secondary | ICD-10-CM

## 2014-03-14 MED ORDER — FLUOXETINE HCL 40 MG PO CAPS: 40.0000 mg | ORAL_CAPSULE | Freq: Every day | ORAL | Status: DC

## 2014-03-14 NOTE — Progress Notes (Signed)
   Subjective:    Patient ID: Thomas Duran, male    DOB: Jan 11, 1976, 38 y.o.   MRN: [id number]  HPI  38 year old white male with history of generalized anxiety disorder and GERD for routine follow-up. Patient reports there is been exacerbation of his anxiety symptoms. His younger brother committed suicide. Younger brother had history of schizophrenia and anxiety.  Patient also reports she underwent health screening at work in October 2015. It was notable for elevated cholesterol levels.  He also complains of occasional fasciculations of left thumb.  No issues with dropping items or left hand grip.  Review of Systems Negative for weight change.  He denies panic attacks    Past Medical History  Diagnosis Date  . Generalized anxiety disorder   . Hypogonadism in male     Managed by endo - Dr. Buddy Duty    History   Social History  . Marital Status: Married    Spouse Name: N/A    Number of Children: N/A  . Years of Education: N/A   Occupational History  .      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 Smoking status: Never Smoker   . Smokeless tobacco: Not on file  . Alcohol Use: Yes  . Drug Use: No  . Sexual Activity: Not on file   Other Topics Concern  . Not on file   Social History Narrative    No past surgical history on file.  Family History  Problem Relation Age of Onset  . Schizophrenia Brother     died 24 - suicide  . Coronary artery disease Maternal Grandfather     No Known Allergies  Current Outpatient Prescriptions on File Prior to Visit  Medication Sig Dispense Refill  . B-D 3CC LUER-LOK SYR 21GX1-1/2 21G X 1-1/2" 3 ML MISC USE TO INJECT TESTOSTERONE ONCE EVERY 2 WEEKS 8 each 1  . ranitidine (ZANTAC) 150 MG tablet Take 150 mg by mouth 2 (two) times daily.    Marland Kitchen testosterone cypionate (DEPOTESTOTERONE CYPIONATE) 200 MG/ML injection Inject 200 mg into the muscle every 14 (fourteen) days.     No current facility-administered  medications on file prior to visit.    BP 114/76 mmHg  Pulse 75  Temp(Src) 98.6 F (37 C) (Oral)  Ht 6\' 1"  (1.854 m)  Wt 197 lb (89.359 kg)  BMI 26.00 kg/m2    Objective:   Physical Exam  Constitutional: He is oriented to person, place, and time. He appears well-developed and well-nourished.  HENT:  Head: Normocephalic and atraumatic.  Eyes: EOM are normal. Pupils are equal, round, and reactive to light.  Cardiovascular: Normal rate, regular rhythm and normal heart sounds.   Pulmonary/Chest: Effort normal and breath sounds normal.  Musculoskeletal: He exhibits no edema.  Neurological: He is alert and oriented to person, place, and time.  Muscle strength is 5 out of 5-upper extremities  Skin: Skin is warm and dry.  Psychiatric: He has a normal mood and affect. His behavior is normal.          Assessment & Plan:

## 2014-03-14 NOTE — Assessment & Plan Note (Signed)
Patient reports intermittent fasciculation of left thumb. His muscle strength of upper extremities and reflexes are normal.  Monitor for now.  His symptoms likely benign.

## 2014-03-14 NOTE — Assessment & Plan Note (Signed)
Patient reports lipid panel elevated during employer physical.  Handout for low saturated fat diet provided.  Monitor lipid panel before next OV.  Check HS CRP.

## 2014-03-14 NOTE — Patient Instructions (Signed)
Please complete the following lab tests before your next follow up appointment: FLP - 272.4 

## 2014-03-14 NOTE — Assessment & Plan Note (Signed)
Patient experiencing exacerbation. His brother committed suicide.  Increase fluoxetine to 40 mg.  Patient advised to call office if anxiety symptoms worsen.

## 2014-03-14 NOTE — Progress Notes (Signed)
Pre visit review using our clinic review tool, if applicable. No additional management support is needed unless otherwise documented below in the visit note. 

## 2014-04-10 ENCOUNTER — Other Ambulatory Visit: Payer: Self-pay | Admitting: Internal Medicine

## 2014-04-12 ENCOUNTER — Other Ambulatory Visit: Payer: Self-pay | Admitting: Internal Medicine

## 2014-05-23 ENCOUNTER — Other Ambulatory Visit (INDEPENDENT_AMBULATORY_CARE_PROVIDER_SITE_OTHER): Payer: BLUE CROSS/BLUE SHIELD

## 2014-05-23 DIAGNOSIS — Z0000 Encounter for general adult medical examination without abnormal findings: Secondary | ICD-10-CM

## 2014-05-23 DIAGNOSIS — E785 Hyperlipidemia, unspecified: Secondary | ICD-10-CM

## 2014-05-23 LAB — LIPID PANEL
Cholesterol: 210 mg/dL — ABNORMAL HIGH (ref 0–200)
HDL: 39.8 mg/dL (ref >39.00)
LDL Cholesterol: 141 mg/dL — ABNORMAL HIGH (ref 0–99)
NonHDL: 170.2
Total CHOL/HDL Ratio: 5
Triglycerides: 144 mg/dL (ref 0.0–149.0)
VLDL: 28.8 mg/dL (ref 0.0–40.0)

## 2014-05-23 LAB — CBC WITH DIFFERENTIAL/PLATELET
Basophils Absolute: 0 10*3/uL (ref 0.0–0.1)
Basophils Relative: 0.4 % (ref 0.0–3.0)
Eosinophils Absolute: 0.2 10*3/uL (ref 0.0–0.7)
Eosinophils Relative: 3.3 % (ref 0.0–5.0)
HCT: 45.8 % (ref 39.0–52.0)
Hemoglobin: 15.8 g/dL (ref 13.0–17.0)
Lymphocytes Relative: 28.4 % (ref 12.0–46.0)
Lymphs Abs: 1.7 10*3/uL (ref 0.7–4.0)
MCHC: 34.4 g/dL (ref 30.0–36.0)
MCV: 87.7 fl (ref 78.0–100.0)
Monocytes Absolute: 0.5 10*3/uL (ref 0.1–1.0)
Monocytes Relative: 8.4 % (ref 3.0–12.0)
Neutro Abs: 3.5 10*3/uL (ref 1.4–7.7)
Neutrophils Relative %: 59.5 % (ref 43.0–77.0)
Platelets: 191 10*3/uL (ref 150.0–400.0)
RBC: 5.23 Mil/uL (ref 4.22–5.81)
RDW: 12.8 % (ref 11.5–15.5)
WBC: 5.8 10*3/uL (ref 4.0–10.5)

## 2014-05-23 LAB — BASIC METABOLIC PANEL
BUN: 16 mg/dL (ref 6–23)
CO2: 27 mEq/L (ref 19–32)
Calcium: 9.3 mg/dL (ref 8.4–10.5)
Chloride: 102 mEq/L (ref 96–112)
Creatinine, Ser: 1.03 mg/dL (ref 0.40–1.50)
GFR: 86.19 mL/min (ref >60.00)
Glucose, Bld: 71 mg/dL (ref 70–99)
Potassium: 4 mEq/L (ref 3.5–5.1)
Sodium: 138 mEq/L (ref 135–145)

## 2014-05-23 LAB — TSH: TSH: 1.4 u[IU]/mL (ref 0.35–4.50)

## 2014-05-23 LAB — HIGH SENSITIVITY CRP: CRP, High Sensitivity: 0.55 mg/L (ref 0.000–5.000)

## 2014-05-23 NOTE — Addendum Note (Signed)
Addended by: Joyce Gross R on: 05/23/2014 09:03 AM   Modules accepted: Orders

## 2014-05-30 ENCOUNTER — Ambulatory Visit: Payer: BC Managed Care – PPO | Admitting: Internal Medicine

## 2014-06-11 ENCOUNTER — Other Ambulatory Visit: Payer: Self-pay | Admitting: Internal Medicine

## 2014-06-13 ENCOUNTER — Telehealth: Payer: Self-pay | Admitting: *Deleted

## 2014-06-13 NOTE — Telephone Encounter (Signed)
Please advise 

## 2014-06-13 NOTE — Telephone Encounter (Signed)
Pt called stating he is on the medication of fluoxetine 40mg  pt states the 40 is to strong pt needs the 20 mg, pt states he would like it changed today because he is going out of town. Please advise

## 2014-06-13 NOTE — Telephone Encounter (Signed)
Pt aware.

## 2014-06-13 NOTE — Telephone Encounter (Signed)
Ok to call in fluoxetine 20 mg same sig and qty

## 2014-06-20 ENCOUNTER — Ambulatory Visit: Payer: BLUE CROSS/BLUE SHIELD | Admitting: Internal Medicine

## 2014-06-29 ENCOUNTER — Ambulatory Visit (INDEPENDENT_AMBULATORY_CARE_PROVIDER_SITE_OTHER): Payer: BLUE CROSS/BLUE SHIELD | Admitting: Internal Medicine

## 2014-06-29 ENCOUNTER — Encounter: Payer: Self-pay | Admitting: Internal Medicine

## 2014-06-29 VITALS — BP 114/74 | HR 68 | Temp 98.4°F | Ht 73.0 in | Wt 195.0 lb

## 2014-06-29 DIAGNOSIS — R053 Chronic cough: Secondary | ICD-10-CM | POA: Insufficient documentation

## 2014-06-29 DIAGNOSIS — R05 Cough: Secondary | ICD-10-CM

## 2014-06-29 DIAGNOSIS — F411 Generalized anxiety disorder: Secondary | ICD-10-CM

## 2014-06-29 MED ORDER — SERTRALINE HCL 50 MG PO TABS: 50.0000 mg | ORAL_TABLET | Freq: Every day | ORAL | Status: DC

## 2014-06-29 MED ORDER — FLUTICASONE PROPIONATE 50 MCG/ACT NA SUSP: 2.0000 | Freq: Every day | NASAL | Status: AC

## 2014-06-29 NOTE — Assessment & Plan Note (Signed)
40 year old white male complains of chronic cough 2 months. Chest exam is normal. His symptoms may be secondary to postnasal drip. Obtain chest x-ray to rule out atypical infection. Trial of intranasal saline and Flonase.  Patient advised to call office if symptoms persist or worsen.

## 2014-06-29 NOTE — Assessment & Plan Note (Signed)
Patient unable to tolerate higher dose of fluoxetine. He has tried and failed Effexor and Cymbalta in the past. Trial of sertraline 50 mg once daily.  He has also had limited success with counseling in the past.

## 2014-06-29 NOTE — Progress Notes (Signed)
Pre visit review using our clinic review tool, if applicable. No additional management support is needed unless otherwise documented below in the visit note. 

## 2014-06-29 NOTE — Progress Notes (Signed)
   Subjective:    Patient ID: Thomas Duran, male    DOB: 01/14/1976, 39 y.o.   MRN: [id number]  HPI  39 year old white male with history of generalized anxiety disorder for follow-up. At previous visit we increased his fluoxetine 40 mg. Patient only took high dose for several days. He discontinued due to side effect of feeling "shaky" and "fidgety".  He resumed taking fluoxetine 20 mg. Despite taking this dose she has difficulty managing stress at work. He reports trying Effexor and Cymbalta in the past.  Patient also complains of chronic cough for 2 months. He denies any wheezing. His symptoms usually worse in the morning. He describes expectorating stringy discolored phlegm.  Review of Systems Negative for history of asthma, negative for shortness of breath    Past Medical History  Diagnosis Date  . Generalized anxiety disorder   . Hypogonadism in male     Managed by endo - Dr. Buddy Duty    History   Social History  . Marital Status: Married    Spouse Name: N/A  . Number of Children: N/A  . Years of Education: N/A   Occupational History  .      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 Smoking status: Never Smoker   . Smokeless tobacco: Not on file  . Alcohol Use: Yes  . Drug Use: No  . Sexual Activity: Not on file   Other Topics Concern  . Not on file   Social History Narrative    No past surgical history on file.  Family History  Problem Relation Age of Onset  . Schizophrenia Brother     died 38 - suicide  . Coronary artery disease Maternal Grandfather     No Known Allergies  Current Outpatient Prescriptions on File Prior to Visit  Medication Sig Dispense Refill  . B-D 3CC LUER-LOK SYR 21GX1-1/2 21G X 1-1/2" 3 ML MISC USE TO INJECT TESTOSTERONE ONCE EVERY 2 WEEKS 8 each 1  . FLUoxetine (PROZAC) 20 MG capsule TAKE ONE CAPSULE BY MOUTH EVERY DAY 30 capsule 0  . ranitidine (ZANTAC) 150 MG tablet Take 150 mg by mouth 2 (two) times  daily.    Marland Kitchen testosterone cypionate (DEPOTESTOTERONE CYPIONATE) 200 MG/ML injection Inject 200 mg into the muscle every 14 (fourteen) days.     No current facility-administered medications on file prior to visit.    BP 114/74 mmHg  Pulse 68  Temp(Src) 98.4 F (36.9 C) (Oral)  Ht 6\' 1"  (1.854 m)  Wt 195 lb (88.451 kg)  BMI 25.73 kg/m2    Objective:   Physical Exam  Constitutional: He is oriented to person, place, and time. He appears well-developed and well-nourished. No distress.  Cardiovascular: Normal rate, regular rhythm and normal heart sounds.   No murmur heard. Pulmonary/Chest: Effort normal and breath sounds normal. He has no wheezes.  Neurological: He is alert and oriented to person, place, and time. No cranial nerve deficit.  Psychiatric: His behavior is normal.          Assessment & Plan:

## 2014-07-07 ENCOUNTER — Other Ambulatory Visit: Payer: Self-pay | Admitting: Internal Medicine

## 2014-07-29 ENCOUNTER — Ambulatory Visit (INDEPENDENT_AMBULATORY_CARE_PROVIDER_SITE_OTHER)
Admission: RE | Admit: 2014-07-29 | Discharge: 2014-07-29 | Disposition: A | Discharge status: Home / Self Care | Payer: BLUE CROSS/BLUE SHIELD | Source: Ambulatory Visit | Attending: Internal Medicine | Admitting: Internal Medicine

## 2014-07-29 DIAGNOSIS — R05 Cough: Secondary | ICD-10-CM

## 2014-07-29 DIAGNOSIS — R053 Chronic cough: Secondary | ICD-10-CM

## 2014-08-12 ENCOUNTER — Ambulatory Visit: Payer: BLUE CROSS/BLUE SHIELD | Admitting: Internal Medicine

## 2014-08-19 ENCOUNTER — Ambulatory Visit (INDEPENDENT_AMBULATORY_CARE_PROVIDER_SITE_OTHER): Payer: BLUE CROSS/BLUE SHIELD | Admitting: Family Medicine

## 2014-08-19 ENCOUNTER — Encounter: Payer: Self-pay | Admitting: Family Medicine

## 2014-08-19 ENCOUNTER — Ambulatory Visit: Payer: BLUE CROSS/BLUE SHIELD | Admitting: Internal Medicine

## 2014-08-19 VITALS — BP 122/78 | HR 87 | Temp 98.4°F | Wt 198.0 lb

## 2014-08-19 DIAGNOSIS — R05 Cough: Secondary | ICD-10-CM

## 2014-08-19 DIAGNOSIS — F411 Generalized anxiety disorder: Secondary | ICD-10-CM

## 2014-08-19 DIAGNOSIS — R053 Chronic cough: Secondary | ICD-10-CM

## 2014-08-19 MED ORDER — PREDNISONE 10 MG PO TABS: ORAL_TABLET | ORAL | Status: DC

## 2014-08-19 NOTE — Patient Instructions (Signed)
Let me know if cough not better in one week.

## 2014-08-19 NOTE — Progress Notes (Signed)
Pre visit review using our clinic review tool, if applicable. No additional management support is needed unless otherwise documented below in the visit note. 

## 2014-08-19 NOTE — Progress Notes (Signed)
   Subjective:    Patient ID: Thomas Duran, male    DOB: [birth year], 39 y.o.   MRN: [id number]  HPI Patient seen for the following  Follow-up regarding anxiety.. Question generalized anxiety versus situational. Patient has lots of work stress. Previously on Prozac. Recently switched to sertraline. He takes 50 mg once daily. He thinks this is helping. Denies any side effects. No depression symptoms  Chronic cough. Almost 3 months duration. Nonsmoker. Cough is worse at night and early morning. He has some early morning wheezing. He tried some Flonase and currently does not feel he has any postnasal drip symptoms. He has GERD symptoms controlled with Zantac. Recent chest x-ray unremarkable. Patient coughs up white mucus in morning and feels he is wheezing in the mornings and tends to get better later. No pets. No clear environmental triggers. No appetite or weight changes. No dyspnea.  Past Medical History  Diagnosis Date  . Generalized anxiety disorder   . Hypogonadism in male     Managed by endo - Dr. Buddy Duty   No past surgical history on file.  reports that he has never smoked. He does not have any smokeless tobacco history on file. He reports that he drinks alcohol. He reports that he does not use illicit drugs. family history includes Coronary artery disease in his maternal grandfather; Schizophrenia in his brother. No Known Allergies      Review of Systems  Constitutional: Negative for fever and chills.  HENT: Negative for congestion and sore throat.   Respiratory: Positive for cough and wheezing. Negative for shortness of breath.   Cardiovascular: Negative for chest pain.  Genitourinary: Negative for dysuria.  Neurological: Negative for headaches.  Psychiatric/Behavioral: Negative for dysphoric mood and agitation.       Objective:   Physical Exam  Constitutional: He appears well-developed and well-nourished.  HENT:  Mouth/Throat: Oropharynx is clear and moist.    Cardiovascular: Normal rate and regular rhythm.   Pulmonary/Chest: Effort normal and breath sounds normal. No respiratory distress. He has no rales.  Couple of faint wheezes are noted lower lung fields. No retractions          Assessment & Plan:     #1 anxiety. Improved on sertraline. Patient currently happy with medication at this dosage. No changes made today #2 persistent cough. No postnasal drip at this point. Patient reports some intermittent wheezing. Prednisone taper. Touch base in one week if cough not resolving. Consider low-dose steroid inhaler if cough returns after stopping prednisone

## 2014-10-15 ENCOUNTER — Other Ambulatory Visit: Payer: Self-pay | Admitting: Internal Medicine

## 2014-10-31 ENCOUNTER — Encounter: Payer: Self-pay | Admitting: Internal Medicine

## 2014-10-31 ENCOUNTER — Ambulatory Visit (INDEPENDENT_AMBULATORY_CARE_PROVIDER_SITE_OTHER): Payer: BLUE CROSS/BLUE SHIELD | Admitting: Internal Medicine

## 2014-10-31 VITALS — BP 116/70 | Temp 98.4°F | Wt 202.2 lb

## 2014-10-31 DIAGNOSIS — R0683 Snoring: Secondary | ICD-10-CM

## 2014-10-31 DIAGNOSIS — Z23 Encounter for immunization: Secondary | ICD-10-CM | POA: Diagnosis not present

## 2014-10-31 DIAGNOSIS — G4733 Obstructive sleep apnea (adult) (pediatric): Secondary | ICD-10-CM | POA: Diagnosis not present

## 2014-10-31 NOTE — Assessment & Plan Note (Addendum)
39 year old white cell with history of mild sleep apnea complains of worsening symptoms despite using oral appliance last 2 years. Refer to Dr. Elsworth Soho for further evaluation and treatment. Patient encouraged to work on 15 pound weight loss over the next 6 months.  Patient will discuss whether tx for hypogonadism may be worsening OSA

## 2014-10-31 NOTE — Progress Notes (Signed)
Pre visit review using our clinic review tool, if applicable. No additional management support is needed unless otherwise documented below in the visit note. 

## 2014-10-31 NOTE — Progress Notes (Signed)
   Subjective:    Patient ID: Thomas Duran, male    DOB: December 05, 1975, 39 y.o.   MRN: [id number]  HPI  39 year old white male with history of generalized anxiety disorder and sleep apnea for follow-up. Patient reports his wife is complaining that he snoring was unusual. She has also witnessed apneic periods overnight.  Patient had home sleep test in August 2013. It was noted for for mild sleep apnea. Patient is symptomatic despite using oral appliance for last 2 years.   His weight range is between 195-205.  His shirt collar size is 16 1/2.    Occasional fatigue.  He denies issues with falling asleep while driving.   Review of Systems Hx of hypogonadism - managed by Dr. Buddy Duty    Past Medical History  Diagnosis Date  . Generalized anxiety disorder   . Hypogonadism in male     Managed by endo - Dr. Buddy Duty    History   Social History  . Marital Status: Married    Spouse Name: N/A  . Number of Children: N/A  . Years of Education: N/A   Occupational History  .      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 Smoking status: Never Smoker   . Smokeless tobacco: Not on file  . Alcohol Use: Yes  . Drug Use: No  . Sexual Activity: Not on file   Other Topics Concern  . Not on file   Social History Narrative    No past surgical history on file.  Family History  Problem Relation Age of Onset  . Schizophrenia Brother     died 67 - suicide  . Coronary artery disease Maternal Grandfather     No Known Allergies  Current Outpatient Prescriptions on File Prior to Visit  Medication Sig Dispense Refill  . B-D 3CC LUER-LOK SYR 21GX1-1/2 21G X 1-1/2" 3 ML MISC USE TO INJECT TESTOSTERONE ONCE EVERY 2 WEEKS 8 each 1  . ranitidine (ZANTAC) 150 MG tablet Take 150 mg by mouth 2 (two) times daily.    . sertraline (ZOLOFT) 50 MG tablet TAKE 1 TABLET (50 MG TOTAL) BY MOUTH DAILY. 30 tablet 3  . testosterone cypionate (DEPOTESTOTERONE CYPIONATE) 200 MG/ML  injection Inject 200 mg into the muscle every 14 (fourteen) days.    . fluticasone (FLONASE) 50 MCG/ACT nasal spray Place 2 sprays into both nostrils daily. (Patient not taking: Reported on 10/31/2014) 16 g 2   No current facility-administered medications on file prior to visit.    BP 116/70 mmHg  Temp(Src) 98.4 F (36.9 C) (Oral)  Wt 202 lb 3.2 oz (91.717 kg)    Objective:   Physical Exam  Constitutional: He is oriented to person, place, and time. He appears well-developed and well-nourished. No distress.  HENT:  Head: Normocephalic and atraumatic.  Slightly crowded oropharynx  Neck: Normal range of motion. Neck supple.  Cardiovascular: Normal rate, regular rhythm and normal heart sounds.   Pulmonary/Chest: Effort normal and breath sounds normal. He has no wheezes.  Lymphadenopathy:    He has no cervical adenopathy.  Neurological: He is alert and oriented to person, place, and time.  Psychiatric: He has a normal mood and affect. His behavior is normal.          Assessment & Plan:

## 2014-10-31 NOTE — Patient Instructions (Addendum)
Our office will contact you re: referral to sleep specialist (Dr. Elsworth Soho) Please work on 15 lbs weight loss over next 6 months

## 2014-11-12 ENCOUNTER — Other Ambulatory Visit: Payer: Self-pay | Admitting: Internal Medicine

## 2015-01-02 ENCOUNTER — Encounter: Payer: Self-pay | Admitting: Pulmonary Disease

## 2015-01-02 ENCOUNTER — Ambulatory Visit (INDEPENDENT_AMBULATORY_CARE_PROVIDER_SITE_OTHER): Payer: BLUE CROSS/BLUE SHIELD | Admitting: Pulmonary Disease

## 2015-01-02 VITALS — BP 118/72 | HR 62 | Ht 73.0 in | Wt 198.0 lb

## 2015-01-02 DIAGNOSIS — G4733 Obstructive sleep apnea (adult) (pediatric): Secondary | ICD-10-CM | POA: Diagnosis not present

## 2015-01-02 NOTE — Assessment & Plan Note (Signed)
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  If above suggestions did not work, may need CPAP therapy  The pathophysiology of obstructive sleep apnea , it's cardiovascular consequences & modes of treatment including CPAP were discused with the patient in detail & they evidenced understanding.

## 2015-01-02 NOTE — Patient Instructions (Signed)
You have positional obstructive sleep apnea  Avoid sleeping in supine position Check out [email or web address] for positional device Trial of nasal  spray -each nare at bedtime Use dental appliance & once comfortable call us back to schedule home sleep study

## 2015-01-02 NOTE — Progress Notes (Signed)
Subjective:    Patient ID: Thomas Duran, male    DOB: Oct 13, 1975, 39 y.o.   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 His wife has noted loud snoring for the past 3-4 years, there is no witnessed apneas or choking or gasping episodes and asleep. Snoring is worse when he lies supine and also after he has had alcohol close to bedtime. He denies daytime drowsiness and is able to drive long distances for work. Epworth Sleepiness score is 8. Bedtime is around 10 PM, sleep latency is a few minutes, he reports minimal nocturnal awakenings, sleeps mostly on his side with one pillow and is out of bed at 6 AM feeling refreshed with occasional dryness of mouth but denies headache. There is no history suggestive of cataplexy, sleep paralysis or parasomnias He has not tried snoring strips, he is in the process of getting a mouth guard made-since his father-in-law works in a Chi[employer].  Home sleep study from 2013 reviewed- he AHI about 15/ hour on average, but this seems to be mostly in the supine position, AHI is less than 5 in lateral decubitus position. He attributes most of her symptoms to starting testosterone, and wonders of snoring would decrease after stopping testosterone supplements. He does not feel like these ever helped him with his daytime fatigue   Past Medical History  Diagnosis Date  . Generalized anxiety disorder   . Hypogonadism in male     Managed by endo - Dr. Buddy Duty    No past surgical history on file.  No Known Allergies  Social History   Social History  . Marital Status: Married    Spouse Name: N/A  . Number of Children: 2  . Years of Education: N/A   Occupational History  . 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 Smoking status: Never Smoker   . Smokeless tobacco: Not on file  . Alcohol Use: 0.0 oz/week    0 Standard drinks or equivalent per week   Comment: 2-4 a week  . Drug Use: No  . Sexual Activity: Not on file   Other Topics Concern  . Not on file   Social History Narrative    Family History  Problem Relation Age of Onset  . Schizophrenia Brother     died 22 - suicide  . Coronary artery disease Maternal Grandfather      Review of Systems  Constitutional: Negative for fever and unexpected weight change.  HENT: Negative for congestion, dental problem, ear pain, nosebleeds, postnasal drip, rhinorrhea, sinus pressure, sneezing, sore throat and trouble swallowing.   Eyes: Negative for redness and itching.  Respiratory: Negative for cough, chest tightness, shortness of breath and wheezing.   Cardiovascular: Negative for palpitations and leg swelling.  Gastrointestinal: Negative for nausea and vomiting.  Genitourinary: Negative for dysuria.  Musculoskeletal: Negative for joint swelling.  Skin: Negative for rash.  Neurological: Negative for headaches.  Hematological: Does not bruise/bleed easily.  Psychiatric/Behavioral: Positive for dysphoric mood. The patient is nervous/anxious.        Objective:   Physical Exam  Gen. Pleasant, , in no distress, normal affect ENT - no lesions, no post nasal drip, class 2 airway Neck: No JVD, no thyromegaly, no carotid bruits Lungs: no use of accessory muscles, no dullness to percussion, decreased without rales or rhonchi  Cardiovascular: Rhythm regular, heart sounds  normal, no murmurs or  gallops, no peripheral edema Abdomen: soft and non-tender, no hepatosplenomegaly, BS normal. Musculoskeletal: No deformities, no cyanosis or clubbing Neuro:  alert, non focal, no tremors       Assessment & Plan:

## 2015-02-10 ENCOUNTER — Other Ambulatory Visit: Payer: Self-pay | Admitting: Internal Medicine

## 2015-03-20 ENCOUNTER — Ambulatory Visit: Payer: BLUE CROSS/BLUE SHIELD | Admitting: Pulmonary Disease

## 2015-06-14 ENCOUNTER — Other Ambulatory Visit: Payer: Self-pay | Admitting: Internal Medicine

## 2015-06-15 ENCOUNTER — Other Ambulatory Visit: Payer: Self-pay | Admitting: Internal Medicine

## 2015-10-19 ENCOUNTER — Ambulatory Visit (INDEPENDENT_AMBULATORY_CARE_PROVIDER_SITE_OTHER): Payer: BLUE CROSS/BLUE SHIELD | Admitting: Family Medicine

## 2015-10-19 ENCOUNTER — Encounter: Payer: Self-pay | Admitting: Family Medicine

## 2015-10-19 VITALS — BP 116/74 | HR 60 | Temp 98.5°F | Resp 12 | Ht 73.0 in | Wt 201.0 lb

## 2015-10-19 DIAGNOSIS — L049 Acute lymphadenitis, unspecified: Secondary | ICD-10-CM

## 2015-10-19 DIAGNOSIS — W57XXXA Bitten or stung by nonvenomous insect and other nonvenomous arthropods, initial encounter: Secondary | ICD-10-CM

## 2015-10-19 DIAGNOSIS — S60561A Insect bite (nonvenomous) of right hand, initial encounter: Secondary | ICD-10-CM | POA: Diagnosis not present

## 2015-10-19 MED ORDER — DOXYCYCLINE HYCLATE 100 MG PO TABS: 100.0000 mg | ORAL_TABLET | Freq: Two times a day (BID) | ORAL | Status: DC

## 2015-10-19 NOTE — Progress Notes (Addendum)
HPI:  ACUTE VISIT:  Chief Complaint  Patient presents with  . body aches, chills    Bumps showed up behind ear tuesday-wednesday. Yesterday afternoon-night time, chills/aches started. no fever    Thomas Duran is a 40 y.o. male, who is here today complaining of 2-3 days of painful and enlarged lymph nodes.   About 3 days ago she noted tender lymph nodes in the left preauricular area, but this has improved.   Yesterday she noted moderate right axillary pain. It is exacerbated by right shoulder movement and local palpation of axillary area, it is alleviated by rest.  He just came back from the beach, he has a few mosquito bites, he has not noted fever, has had chills and muscle aching.  Mild frontal headache. She denies oral lesions, odynophagia or dysphagia, cough, dyspnea, wheezing, abdominal pain, vomiting, changes in bowel habits, or focal weakness. No ecchymosis or easy bruising. He has not noted breast tenderness or nipple discharge. She swam in the ocean but no in lakes or public pools. No cat scratches or specific exposure. His wife had some ear ache last week and his daughter pink eye as we can. He has not noted earache or hearing loss, no numbness or tingling.   He has taken over-the-counter Advil.   Review of Systems  Constitutional: Negative for fever, chills, activity change, appetite change and fatigue.  HENT: Negative for dental problem, ear discharge, ear pain, hearing loss, mouth sores, nosebleeds, postnasal drip, sore throat, trouble swallowing and voice change.   Eyes: Negative for discharge, redness, itching and visual disturbance.  Respiratory: Negative for cough, chest tightness, shortness of breath and wheezing.   Cardiovascular: Negative for chest pain and leg swelling.  Gastrointestinal: Positive for nausea (mild yesterday.). Negative for vomiting, abdominal pain and diarrhea.  Genitourinary: Negative for dysuria, hematuria and decreased  urine volume.  Musculoskeletal: Negative for back pain, joint swelling, arthralgias and neck pain.  Skin: Positive for rash. Negative for color change, pallor and wound.  Neurological: Positive for headaches. Negative for tremors, syncope, weakness and numbness.  Hematological: Positive for adenopathy. Does not bruise/bleed easily.      Current Outpatient Prescriptions on File Prior to Visit  Medication Sig Dispense Refill  . B-D 3CC LUER-LOK SYR 21GX1-1/2 21G X 1-1/2" 3 ML MISC USE TO INJECT TESTOSTERONE ONCE EVERY 2 WEEKS 8 each 1  . ranitidine (ZANTAC) 150 MG tablet Take 150 mg by mouth 2 (two) times daily.    . sertraline (ZOLOFT) 50 MG tablet TAKE 1 TABLET (50 MG TOTAL) BY MOUTH DAILY. 30 tablet 3  . sertraline (ZOLOFT) 50 MG tablet TAKE 1 TABLET (50 MG TOTAL) BY MOUTH DAILY. 30 tablet 3  . testosterone cypionate (DEPOTESTOTERONE CYPIONATE) 200 MG/ML injection Inject 200 mg into the muscle every 14 (fourteen) days.     No current facility-administered medications on file prior to visit.     Past Medical History  Diagnosis Date  . Generalized anxiety disorder   . Hypogonadism in male     Managed by endo - Dr. Buddy Duty   No Known Allergies  Social History   Social History  . Marital Status: Married    Spouse Name: N/A  . Number of Children: 2  . Years of Education: N/A   Occupational History  . 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 Smoking status: Never Smoker   . Smokeless tobacco: None  .  Alcohol Use: 0.0 oz/week    0 Standard drinks or equivalent per week     Comment: 2-4 a week  . Drug Use: No  . Sexual Activity: Not Asked   Other Topics Concern  . None   Social History Narrative    Filed Vitals:   10/19/15 1555  BP: 116/74  Pulse: 60  Temp: 98.5 F (36.9 C)  Resp: 12   Body mass index is 26.52 kg/(m^2).  SpO2 Readings from Last 3 Encounters:  10/19/15 98%  01/02/15 98%  03/14/11 98%        Physical Exam  Constitutional: He is oriented to person, place, and time. He appears well-developed and well-nourished. He does not appear ill. No distress.  HENT:  Head: Atraumatic.  Right Ear: Hearing and external ear normal. No swelling or tenderness.  Left Ear: Hearing and external ear normal. No swelling or tenderness.  Nose: Right sinus exhibits no maxillary sinus tenderness and no frontal sinus tenderness. Left sinus exhibits no maxillary sinus tenderness and no frontal sinus tenderness.  Mouth/Throat: Oropharynx is clear and moist and mucous membranes are normal.  Eyes: Conjunctivae and EOM are normal. Pupils are equal, round, and reactive to light.  Neck: No tracheal deviation present.  Cardiovascular: Normal rate and regular rhythm.   No murmur heard. Pulses:      Dorsalis pedis pulses are 2+ on the right side, and 2+ on the left side.  Respiratory: Effort normal and breath sounds normal. No respiratory distress. He has no wheezes. He has no rales. He exhibits no tenderness.  Didn't appreciate breast masses or nipple discharge bilaterally.  Musculoskeletal: He exhibits no edema or tenderness.  No major deformities or signs of synovitis.  Lymphadenopathy:       Head (right side): No submental, no submandibular and no occipital adenopathy present.       Head (left side): Preauricular and posterior auricular adenopathy present. No submental, no submandibular and no occipital adenopathy present.    He has no cervical adenopathy.    He has axillary adenopathy (R>L).       Right: No inguinal, no supraclavicular and no epitrochlear adenopathy present.       Left: No inguinal, no supraclavicular and no epitrochlear adenopathy present.  Neurological: He is alert and oriented to person, place, and time. He has normal strength. No cranial nerve deficit. Coordination and gait normal.  Skin: Skin is warm. Lesion and rash noted. No petechiae noted. Rash is papular.     Papular  lesions on 4th right finger mildly tender upon pressure. Lesions are not indurated, no local heat, and they are scattered on face and extremities.  Psychiatric: He has a normal mood and affect. His speech is normal.  Well groomed, good eye contact.      ASSESSMENT AND PLAN:     Thomas Duran was seen today for body aches, chills.  Diagnoses and all orders for this visit:  Lymphadenitis, acute  I am not sure about the etiology, we discussed some possible causes. For now I will order CBC. Monitor for new associated symptoms. Follow-up in 2-3 weeks if lymphadenopathies are not resolved, before if needed.  -     CBC with Differential/Platelet -     doxycycline (VIBRA-TABS) 100 MG tablet; Take 1 tablet (100 mg total) by mouth 2 (two) times daily.  Insect bite hand, right, initial encounter   Because history of insect bites + flu like symptoms, I recommended empiric antibiotic treatment.  -  doxycycline (VIBRA-TABS) 100 MG tablet; Take 1 tablet (100 mg total) by mouth 2 (two) times daily.          -Mr.Duran Coello Deberry advised to return or notify a doctor immediately if symptoms worsen or persist or new concerns arise, he voices understanding.       Thoma Paulsen G. [place], MD  [facility]. [place] office.

## 2015-10-19 NOTE — Patient Instructions (Signed)
A few things to remember from today's visit:   Lymphadenitis, acute - Plan: CBC with Differential/Platelet, doxycycline (VIBRA-TABS) 100 MG tablet  Insect bite hand, right, initial encounter - Plan: doxycycline (VIBRA-TABS) 100 MG tablet  Monitor for new symptoms.  Please follow in 2-3 weeks if still having symptoms, before if worsening.   Please be sure medication list is accurate. If a new problem present, please set up appointment sooner than planned today.  Ibuprofen or Tylenol for pain.

## 2015-10-19 NOTE — Progress Notes (Signed)
Pre visit review using our clinic review tool, if applicable. No additional management support is needed unless otherwise documented below in the visit note. 

## 2015-10-20 LAB — CBC WITH DIFFERENTIAL/PLATELET
Basophils Absolute: 0 10*3/uL (ref 0.0–0.1)
Basophils Relative: 0.3 % (ref 0.0–3.0)
Eosinophils Absolute: 0.3 10*3/uL (ref 0.0–0.7)
Eosinophils Relative: 2.8 % (ref 0.0–5.0)
HCT: 44.5 % (ref 39.0–52.0)
Hemoglobin: 15.1 g/dL (ref 13.0–17.0)
Lymphocytes Relative: 18.5 % (ref 12.0–46.0)
Lymphs Abs: 1.9 10*3/uL (ref 0.7–4.0)
MCHC: 34.1 g/dL (ref 30.0–36.0)
MCV: 87.5 fl (ref 78.0–100.0)
Monocytes Absolute: 1.2 10*3/uL — ABNORMAL HIGH (ref 0.1–1.0)
Monocytes Relative: 11.8 % (ref 3.0–12.0)
Neutro Abs: 6.9 10*3/uL (ref 1.4–7.7)
Neutrophils Relative %: 66.6 % (ref 43.0–77.0)
Platelets: 191 10*3/uL (ref 150.0–400.0)
RBC: 5.08 Mil/uL (ref 4.22–5.81)
RDW: 12.9 % (ref 11.5–15.5)
WBC: 10.4 10*3/uL (ref 4.0–10.5)

## 2015-11-10 ENCOUNTER — Ambulatory Visit: Payer: BLUE CROSS/BLUE SHIELD | Admitting: Family Medicine

## 2015-12-15 ENCOUNTER — Encounter: Payer: Self-pay | Admitting: Family Medicine

## 2015-12-15 ENCOUNTER — Ambulatory Visit (INDEPENDENT_AMBULATORY_CARE_PROVIDER_SITE_OTHER): Payer: BLUE CROSS/BLUE SHIELD | Admitting: Family Medicine

## 2015-12-15 VITALS — BP 122/88 | HR 68 | Temp 98.2°F | Ht 72.0 in | Wt 196.6 lb

## 2015-12-15 DIAGNOSIS — E291 Testicular hypofunction: Secondary | ICD-10-CM

## 2015-12-15 DIAGNOSIS — L659 Nonscarring hair loss, unspecified: Secondary | ICD-10-CM | POA: Diagnosis not present

## 2015-12-15 DIAGNOSIS — F411 Generalized anxiety disorder: Secondary | ICD-10-CM

## 2015-12-15 DIAGNOSIS — E785 Hyperlipidemia, unspecified: Secondary | ICD-10-CM

## 2015-12-15 DIAGNOSIS — K219 Gastro-esophageal reflux disease without esophagitis: Secondary | ICD-10-CM | POA: Diagnosis not present

## 2015-12-15 LAB — COMPREHENSIVE METABOLIC PANEL
ALT: 27 U/L (ref 0–53)
AST: 18 U/L (ref 0–37)
Albumin: 4.6 g/dL (ref 3.5–5.2)
Alkaline Phosphatase: 47 U/L (ref 39–117)
BUN: 14 mg/dL (ref 6–23)
CO2: 32 mEq/L (ref 19–32)
Calcium: 9.4 mg/dL (ref 8.4–10.5)
Chloride: 102 mEq/L (ref 96–112)
Creatinine, Ser: 0.92 mg/dL (ref 0.40–1.50)
GFR: 97.37 mL/min (ref >60.00)
Glucose, Bld: 80 mg/dL (ref 70–99)
Potassium: 4.1 mEq/L (ref 3.5–5.1)
Sodium: 140 mEq/L (ref 135–145)
Total Bilirubin: 0.6 mg/dL (ref 0.2–1.2)
Total Protein: 7.3 g/dL (ref 6.0–8.3)

## 2015-12-15 LAB — TSH: TSH: 1.71 u[IU]/mL (ref 0.35–4.50)

## 2015-12-15 MED ORDER — SERTRALINE HCL 100 MG PO TABS: ORAL_TABLET | ORAL | 5 refills | Status: DC

## 2015-12-15 NOTE — Progress Notes (Signed)
Phone: [phone]  Subjective:  Patient presents today to establish care with me as their new primary care provider. Patient was formerly a patient of Dr. Shawna Orleans. Chief complaint-noted.   See problem oriented charting- ROS- no SI/HI. No chest pain or shortness of breath. No headache or blurry vision.   The following were reviewed and entered/updated in epic: Past Medical History:  Diagnosis Date  . Generalized anxiety disorder   . Hypogonadism in male    Managed by endo - Dr. Buddy Duty   Patient Active Problem List   Diagnosis Date Noted  . Hyperlipidemia 03/14/2014    Priority: Medium  . OSA (obstructive sleep apnea) 11/08/2011    Priority: Medium  . GAD (generalized anxiety disorder) 02/13/2011    Priority: Medium  . Gastroesophageal reflux disease 02/13/2011    Priority: Medium  . Hypogonadism male 02/13/2011    Priority: Medium  . Chronic cough 06/29/2014    Priority: Low  . MRSA infection 03/29/2011    Priority: Low   Past Surgical History:  Procedure Laterality Date  . WISDOM TOOTH EXTRACTION      Family History  Problem Relation Age of Onset  . Schizophrenia Brother     died 13 - suicide. diagnosis was unclear- brother would not get help  . Dementia Maternal Grandfather   . Healthy Mother   . Arthritis Father   . Anxiety disorder Sister   . Cancer Paternal Grandmother     unknown= in bones  . Emphysema Paternal Grandfather   . Heart Problems Paternal Grandfather     pacemaker    Medications- reviewed and updated Current Outpatient Prescriptions  Medication Sig Dispense Refill  . ranitidine (ZANTAC) 150 MG tablet Take 150 mg by mouth 2 (two) times daily.    . sertraline (ZOLOFT) 100 MG tablet TAKE 1 TABLET (100 MG TOTAL) BY MOUTH DAILY. 30 tablet 5   No current facility-administered medications for this visit.     Allergies-reviewed and updated No Known Allergies  Social History   Social History  . Marital status: Married    Spouse name: N/A  .  Number of children: 2  . Years of education: N/A   Occupational History  . 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 Smoking status: Never Smoker  . Smokeless tobacco: None  . Alcohol use 0.0 oz/week     Comment: 0-3 a week  . Drug use: No  . Sexual activity: Not Asked   Other Topics Concern  . None   Social History Narrative   Married. 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 Oropharynx normal Neck: no thyromegaly CV: RRR no murmurs rubs or gallops Lungs: CTAB no crackles, wheeze, rhonchi Abdomen: soft/nontender/nondistended/normal bowel sounds. No rebound or guarding.  Ext:  no edema Skin: warm, dry Neuro: grossly normal, moves all extremities, PERRLA  Assessment/Plan:  Hyperlipidemia S: poorly controlled on no medicine. No myalgias.  Lab Results  Component Value Date   CHOL 210 (H) 05/23/2014   HDL 39.80 05/23/2014   LDLCALC 141 (H) 05/23/2014   TRIG 144.0 05/23/2014   CHOLHDL 5 05/23/2014   A/P: under age 49 and LDL <190- no indication primary prevention at this time- discussed weight loss, healthy eating, exercise   GAD (generalized anxiety disorder) S: zoloft 50mg . Since 2004-2005 has been through many meds including cymbalta, effexor, prozac- no improvement or SE. Counseling- was not very effective. Work stress and family drivers. Brother committed suicide 02/2014.  Anxiety in family but not really talked about.  A/P: GAD7 of 11 and phq9 of 6- anxiety primary issue. No SI/HI. We discussed trial 100mg  zoloft to see if could get some relief- looking for new work which could help as well. Does not want to retrial counseling at this point.    Gastroesophageal reflux disease S: Zantac 150mg  once a day at night. Symptoms if takes after meal.  A/P: we discussed may need BID but at minimum would take before meal- as that has prevented symptoms in past   Hypogonadism male S: Endocrinology management previously - weaned self off. Went on for fatigue- fatigue has not really worsened off.  A/P: no significant change in symptoms off of medicine- will remain off. In fact- snoring improved off medicine which is a benefit  Also notes hair loss- will update cmet and tsh  6-8 week follow up on anxiety  Orders Placed This Encounter  Procedures  . Comprehensive metabolic panel    [place]  . TSH    [place]    Meds ordered this encounter  Medications  . sertraline (ZOLOFT) 100 MG tablet    Sig: TAKE 1 TABLET (100 MG TOTAL) BY MOUTH DAILY.    Dispense:  30 tablet    Refill:  5    Return precautions advised.   Garret Reddish, MD

## 2015-12-15 NOTE — Assessment & Plan Note (Signed)
S: Zantac 150mg  once a day at night. Symptoms if takes after meal.  A/P: we discussed may need BID but at minimum would take before meal- as that has prevented symptoms in past

## 2015-12-15 NOTE — Progress Notes (Signed)
Pre visit review using our clinic review tool, if applicable. No additional management support is needed unless otherwise documented below in the visit note. 

## 2015-12-15 NOTE — Patient Instructions (Addendum)
Increase zoloft to 100mg - follow up in 6-8 weeks. If any thoughts of hurting yourself, contact us immediately  Labs before you leave  If things get stabilized from anxiety- we can likely start doing once a year physicals only

## 2015-12-15 NOTE — Assessment & Plan Note (Signed)
S: poorly controlled on no medicine. No myalgias.  Lab Results  Component Value Date   CHOL 210 (H) 05/23/2014   HDL 39.80 05/23/2014   LDLCALC 141 (H) 05/23/2014   TRIG 144.0 05/23/2014   CHOLHDL 5 05/23/2014   A/P: under age 39 and LDL <190- no indication primary prevention at this time- discussed weight loss, healthy eating, exercise

## 2015-12-15 NOTE — Assessment & Plan Note (Addendum)
S: Endocrinology management previously - weaned self off. Went on for fatigue- fatigue has not really worsened off.  A/P: no significant change in symptoms off of medicine- will remain off. In fact- snoring improved off medicine which is a benefit

## 2015-12-15 NOTE — Assessment & Plan Note (Signed)
S: zoloft 50mg . Since 2004-2005 has been through many meds including cymbalta, effexor, prozac- no improvement or SE. Counseling- was not very effective. Work stress and family drivers. Brother committed suicide 02/2014. Anxiety in family but not really talked about.  A/P: GAD7 of 11 and phq9 of 6- anxiety primary issue. No SI/HI. We discussed trial 100mg  zoloft to see if could get some relief- looking for new work which could help as well. Does not want to retrial counseling at this point.

## 2016-06-14 ENCOUNTER — Other Ambulatory Visit: Payer: Self-pay | Admitting: Family Medicine

## 2016-06-18 ENCOUNTER — Other Ambulatory Visit: Payer: Self-pay | Admitting: Family Medicine

## 2016-12-23 ENCOUNTER — Ambulatory Visit (INDEPENDENT_AMBULATORY_CARE_PROVIDER_SITE_OTHER): Payer: BLUE CROSS/BLUE SHIELD | Admitting: Family Medicine

## 2016-12-23 ENCOUNTER — Encounter: Payer: Self-pay | Admitting: Family Medicine

## 2016-12-23 VITALS — BP 116/84 | HR 63 | Temp 98.4°F | Resp 20 | Wt 207.0 lb

## 2016-12-23 DIAGNOSIS — G4733 Obstructive sleep apnea (adult) (pediatric): Secondary | ICD-10-CM

## 2016-12-23 DIAGNOSIS — F411 Generalized anxiety disorder: Secondary | ICD-10-CM | POA: Diagnosis not present

## 2016-12-23 NOTE — Patient Instructions (Addendum)
From your end- I love your idea of 15-20 lbs weight loss- could reduce the sleep apnea. This could also help with cholesterol and may reduce acid reflux symptoms  Still with your worsening symptoms. We will call you within a week or two about your referral to pulmonology. If you do not hear within 2 weeks, give [place] a call.

## 2016-12-23 NOTE — Assessment & Plan Note (Signed)
S: wife states that his snoring has worsened. Has periods of gasping for air and stoping breathing. Complains of daytime somnolence. Feels like he could fall asleep. 8 hours of sleep but does not feel refreshed.   Prior had issues when on testosterone- resolved off of this. Used to be positional but even on his side he is still having gasping issues even in this position  Was having issues even with oral appliance so stopped using that.   2013 sleep apnea testing- was mild at that time. Has had 10 lbs weight gain in the last year.  A/P: Patient will need to be retested with worsening symptoms despite adjusting his position to reduce OSA. Concern for worsening OSA. Discussed importance of weight loss

## 2016-12-23 NOTE — Assessment & Plan Note (Signed)
S: weaned off the zoloft slowly after switching to new job- [employer], [employer]- less stress. Has been off for 3 months. Had been up to 100mg .  A/P: continue off of medication- will monitor symptoms

## 2016-12-23 NOTE — Progress Notes (Signed)
Subjective:  Thomas Duran is a 41 y.o. year old very pleasant male patient who presents for/with See problem oriented charting ROS- daytime tiredness, wakes up unrefreshed, heavy snoring. No chest pain or shortness of breath or dizziness.    Past Medical History-  Patient Active Problem List   Diagnosis Date Noted  . Hyperlipidemia 03/14/2014    Priority: Medium  . OSA (obstructive sleep apnea) 11/08/2011    Priority: Medium  . GAD (generalized anxiety disorder) 02/13/2011    Priority: Medium  . Gastroesophageal reflux disease 02/13/2011    Priority: Medium  . Hypogonadism male 02/13/2011    Priority: Medium  . Chronic cough 06/29/2014    Priority: Low  . MRSA infection 03/29/2011    Priority: Low   Medications- reviewed and updated Current Outpatient Prescriptions  Medication Sig Dispense Refill  . ranitidine (ZANTAC) 150 MG tablet Take 150 mg by mouth 2 (two) times daily.     Objective: BP 116/84   Pulse 63   Temp 98.4 F (36.9 C)   Resp 20   Wt 207 lb (93.9 kg)   SpO2 97%   BMI 28.07 kg/m  Gen: NAD, resting comfortably No mucus membrane pallor CV: RRR no murmurs rubs or gallops Lungs: CTAB no crackles, wheeze, rhonchi Abdomen: soft/nontender/nondistended/normal bowel sounds. No rebound or guarding. overweight Ext: no edema Skin: warm, dry  Assessment/Plan:  OSA (obstructive sleep apnea) S: wife states that his snoring has worsened. Has periods of gasping for air and stoping breathing. Complains of daytime somnolence. Feels like he could fall asleep. 8 hours of sleep but does not feel refreshed.   Prior had issues when on testosterone- resolved off of this. Used to be positional but even on his side he is still having gasping issues even in this position  Was having issues even with oral appliance so stopped using that.   2013 sleep apnea testing- was mild at that time. Has had 10 lbs weight gain in the last year.  A/P: Patient will need to be retested  with worsening symptoms despite adjusting his position to reduce OSA. Concern for worsening OSA. Discussed importance of weight loss  GAD (generalized anxiety disorder) S: weaned off the zoloft slowly after switching to new job- [employer], [employer]- less stress. Has been off for 3 months. Had been up to 100mg .  A/P: continue off of medication- will monitor symptoms   Orders Placed This Encounter  Procedures  . Ambulatory referral to Pulmonology    Referral Priority:   Routine    Referral Type:   Consultation    Referral Reason:   Specialty Services Required    Requested Specialty:   Pulmonary Disease    Number of Visits Requested:   1   Return precautions advised.  Garret Reddish, MD

## 2017-02-02 ENCOUNTER — Emergency Department (HOSPITAL_COMMUNITY)
Admission: EM | Admit: 2017-02-02 | Discharge: 2017-02-02 | Disposition: A | Discharge status: Home / Self Care | Payer: BLUE CROSS/BLUE SHIELD | Attending: Emergency Medicine | Admitting: Emergency Medicine

## 2017-02-02 ENCOUNTER — Emergency Department (HOSPITAL_COMMUNITY): Payer: BLUE CROSS/BLUE SHIELD

## 2017-02-02 ENCOUNTER — Encounter (HOSPITAL_COMMUNITY): Payer: Self-pay | Admitting: Emergency Medicine

## 2017-02-02 DIAGNOSIS — Z79899 Other long term (current) drug therapy: Secondary | ICD-10-CM | POA: Insufficient documentation

## 2017-02-02 DIAGNOSIS — R001 Bradycardia, unspecified: Secondary | ICD-10-CM

## 2017-02-02 DIAGNOSIS — R079 Chest pain, unspecified: Secondary | ICD-10-CM | POA: Diagnosis not present

## 2017-02-02 DIAGNOSIS — R0602 Shortness of breath: Secondary | ICD-10-CM | POA: Diagnosis not present

## 2017-02-02 DIAGNOSIS — R0789 Other chest pain: Secondary | ICD-10-CM

## 2017-02-02 HISTORY — DX: Gastro-esophageal reflux disease without esophagitis: K21.9

## 2017-02-02 LAB — CBC
HCT: 43.6 % (ref 39.0–52.0)
Hemoglobin: 15 g/dL (ref 13.0–17.0)
MCH: 29.9 pg (ref 26.0–34.0)
MCHC: 34.4 g/dL (ref 30.0–36.0)
MCV: 86.9 fL (ref 78.0–100.0)
Platelets: 197 10*3/uL (ref 150–400)
RBC: 5.02 MIL/uL (ref 4.22–5.81)
RDW: 12.5 % (ref 11.5–15.5)
WBC: 7.2 10*3/uL (ref 4.0–10.5)

## 2017-02-02 LAB — I-STAT TROPONIN, ED: Troponin i, poc: 0 ng/mL (ref 0.00–0.08)

## 2017-02-02 LAB — BASIC METABOLIC PANEL
Anion gap: 5 (ref 5–15)
BUN: 11 mg/dL (ref 6–20)
CO2: 27 mmol/L (ref 22–32)
Calcium: 9.7 mg/dL (ref 8.9–10.3)
Chloride: 105 mmol/L (ref 101–111)
Creatinine, Ser: 0.88 mg/dL (ref 0.61–1.24)
GFR calc Af Amer: >60 mL/min (ref >60)
GFR calc non Af Amer: >60 mL/min (ref >60)
Glucose, Bld: 98 mg/dL (ref 65–99)
Potassium: 4.4 mmol/L (ref 3.5–5.1)
Sodium: 137 mmol/L (ref 135–145)

## 2017-02-02 MED ORDER — KETOROLAC TROMETHAMINE 30 MG/ML IJ SOLN
30.0000 mg | Freq: Once | INTRAMUSCULAR | Status: AC
  Administered 2017-02-02: 30 mg via INTRAMUSCULAR
  Filled 2017-02-02: qty 1

## 2017-02-02 MED ORDER — NAPROXEN 500 MG PO TABS: 500.0000 mg | ORAL_TABLET | Freq: Two times a day (BID) | ORAL | 0 refills | Status: DC

## 2017-02-02 MED ORDER — GI COCKTAIL ~~LOC~~
30.0000 mL | Freq: Once | ORAL | Status: AC
  Administered 2017-02-02: 30 mL via ORAL
  Filled 2017-02-02: qty 30

## 2017-02-02 NOTE — Discharge Instructions (Signed)
You were seen today for chest pain and found to have a low heart rate. This seemed to be self-limited. There was no evidence of heart strain or heart attack. Follow-up with her primary physician and cardiology. If you have recurrence of pain, take naproxen to see if this improves it. You are very low risk for heart disease.

## 2017-02-02 NOTE — ED Notes (Signed)
Pt states pain improved 10 min after taking gi cocktail

## 2017-02-02 NOTE — ED Provider Notes (Signed)
[facility] Provider Note   CSN: [id number] Arrival date & time: 02/02/17  1157     History   Chief Complaint Chief Complaint  Patient presents with  . Chest Pain    HPI Thomas Duran is a 41 y.o. male.  HPI  This is a 41 year old male with a history of reflux who presents with chest pain. Patient reports constant dull chest pain over the left chest for the last 2 days. It radiates into his left arm. It is not improved or worsened by movement, activity, food. He does have a history of reflux and has been taking his reflux medication for increasing epigastric discomfort. Denies any recent fevers, cough, or infectious symptoms. Denies any recent travel or lower extremity swelling. He has not taken anything for his pain. He denies any history of high blood pressure, high cholesterol, diabetes, smoking, early family history of heart disease. Patient was seen at a walk-in clinic earlier today and was sent here for further evaluation as he had an EKG that showed sinus bradycardia with a rate of 48. He does not know what his baseline heart rate is. He denies any history of dizziness or syncope.  Past Medical History:  Diagnosis Date  . Generalized anxiety disorder   . GERD (gastroesophageal reflux disease)   . Hypogonadism in male    Managed by endo - Dr. Buddy Duty    Patient Active Problem List   Diagnosis Date Noted  . Chronic cough 06/29/2014  . Hyperlipidemia 03/14/2014  . OSA (obstructive sleep apnea) 11/08/2011  . MRSA infection 03/29/2011  . GAD (generalized anxiety disorder) 02/13/2011  . Gastroesophageal reflux disease 02/13/2011  . Hypogonadism male 02/13/2011    Past Surgical History:  Procedure Laterality Date  . WISDOM TOOTH EXTRACTION         Home Medications    Prior to Admission medications   Medication Sig Start Date End Date Taking? Authorizing Provider  naproxen (NAPROSYN) 500 MG tablet Take 1 tablet (500 mg  total) by mouth 2 (two) times daily. 02/02/17   Nishant Schrecengost, Barbette Hair, MD  ranitidine (ZANTAC) 150 MG tablet Take 150 mg by mouth 2 (two) times daily.    [provider]    Family History Family History  Problem Relation Age of Onset  . Schizophrenia Brother        died 40 - suicide. diagnosis was unclear- brother would not get help  . Dementia Maternal Grandfather   . Healthy Mother   . Arthritis Father   . Anxiety disorder Sister   . Cancer Paternal Grandmother        unknown= in bones  . Emphysema Paternal Grandfather   . Heart Problems Paternal Grandfather        pacemaker    Social History Social History  Substance Use Topics  . Smoking status: Never Smoker  . Smokeless tobacco: Never Used  . Alcohol use 0.0 oz/week     Comment: 0-3 a week     Allergies   Patient has no known allergies.   Review of Systems Review of Systems  Constitutional: Negative for diaphoresis and fever.  Respiratory: Negative for choking and shortness of breath.   Cardiovascular: Positive for chest pain. Negative for leg swelling.  Gastrointestinal: Negative for abdominal pain, nausea and vomiting.  Neurological: Negative for dizziness and syncope.  All other systems reviewed and are negative.    Physical Exam Updated Vital Signs BP 127/81 (BP Location: Left Arm)   Pulse  72   Temp 97.8 F (36.6 C) (Oral)   Resp 16   Ht 6\' 1"  (1.854 m)   Wt 95.3 kg (210 lb)   SpO2 97%   BMI 27.71 kg/m   Physical Exam  Constitutional: He is oriented to person, place, and time. He appears well-developed and well-nourished. No distress.  HENT:  Head: Normocephalic and atraumatic.  Cardiovascular: Normal rate, regular rhythm and normal heart sounds.   No murmur heard. Pulmonary/Chest: Effort normal and breath sounds normal. No respiratory distress. He has no wheezes. He exhibits no tenderness.  Abdominal: Soft. There is no tenderness.  Musculoskeletal: He exhibits no edema.    Neurological: He is alert and oriented to person, place, and time.  Skin: Skin is warm and dry.  Psychiatric: He has a normal mood and affect.  Nursing note and vitals reviewed.    ED Treatments / Results  Labs (all labs ordered are listed, but only abnormal results are displayed) Labs Reviewed  BASIC METABOLIC PANEL  CBC  I-STAT TROPONIN, ED    EKG  EKG Interpretation  Date/Time:  Sunday February 02 2017 11:59:47 EDT Ventricular Rate:  57 PR Interval:  158 QRS Duration: 92 QT Interval:  412 QTC Calculation: 401 R Axis:   67 Text Interpretation:  Sinus bradycardia Otherwise normal ECG Confirmed by Thayer Jew [phone]) on 02/02/2017 12:13:05 PM       Radiology Dg Chest 2 View  Result Date: 02/02/2017 CLINICAL DATA:  Chest pain and shortness of breath for 2 days. EXAM: CHEST  2 VIEW COMPARISON:  07/29/2014 FINDINGS: The heart size and mediastinal contours are within normal limits. Calcified mediastinal lymph node in the AP window again seen, consistent with old granulomatous disease. Both lungs are clear. The visualized skeletal structures are unremarkable. IMPRESSION: No active cardiopulmonary disease. Electronically Signed   By: Earle Gell M.D.   On: 02/02/2017 12:53    Procedures Procedures (including critical care time)  Medications Ordered in ED Medications  gi cocktail (Maalox,Lidocaine,Donnatal) (30 mLs Oral Given 02/02/17 1318)  ketorolac (TORADOL) 30 MG/ML injection 30 mg (30 mg Intramuscular Given 02/02/17 1318)     Initial Impression / Assessment and Plan / ED Course  I have reviewed the triage vital signs and the nursing notes.  Pertinent labs & imaging results that were available during my care of the patient were reviewed by me and considered in my medical decision making (see chart for details).     Patient presents with constant ongoing chest pain Since yesterday. Vital signs reviewed and reassuring. Heart rates 58. EKG was reviewed and shows  sinus bradycardia from [facility] walk-in clinic. His rate at that time was 48. No evidence of ischemia or arrhythmia. No evidence of block. Heart rate here is in the 50s to 60s. I have reviewed his chart and his baseline heart rate at his last 3 doctors visits has been 60, 63, 68.  He is very low risk ACS. Negative troponin effectively rules out acute ischemia given duration of symptoms. He is PERC negative. Patient was placed on cardiac monitor and monitored for approximately one hour. Additionally he was given a GI cocktail and Toradol for his pain.  2:16 PM Patient with improvement of his symptoms following GI cocktail. Pain is very atypical. Do not feel he needs further workup at this time. Heart rates have been in the 60s without any evidence of arrhythmia. Patient reassured. Follow-up with primary physician and cardiology.  After history, exam, and medical workup I feel the  patient has been appropriately medically screened and is safe for discharge home. Pertinent diagnoses were discussed with the patient. Patient was given return precautions.   Final Clinical Impressions(s) / ED Diagnoses   Final diagnoses:  Atypical chest pain  Sinus bradycardia    New Prescriptions New Prescriptions   NAPROXEN (NAPROSYN) 500 MG TABLET    Take 1 tablet (500 mg total) by mouth 2 (two) times daily.     Merryl Hacker, MD 02/02/17 [phone]

## 2017-02-02 NOTE — ED Triage Notes (Addendum)
Cp x 2 days  Goes down arm has been  Taking  Zantac for heartburn , has helped a little feels funny when he swollows, has some sob , saw a dr today and his heart rate was  48, sent for further tests

## 2017-02-06 DIAGNOSIS — M9901 Segmental and somatic dysfunction of cervical region: Secondary | ICD-10-CM | POA: Diagnosis not present

## 2017-02-12 ENCOUNTER — Encounter: Payer: Self-pay | Admitting: Family Medicine

## 2017-02-12 ENCOUNTER — Ambulatory Visit (INDEPENDENT_AMBULATORY_CARE_PROVIDER_SITE_OTHER): Payer: BLUE CROSS/BLUE SHIELD | Admitting: Family Medicine

## 2017-02-12 ENCOUNTER — Encounter: Payer: Self-pay | Admitting: Pulmonary Disease

## 2017-02-12 ENCOUNTER — Ambulatory Visit (INDEPENDENT_AMBULATORY_CARE_PROVIDER_SITE_OTHER): Payer: BLUE CROSS/BLUE SHIELD | Admitting: Pulmonary Disease

## 2017-02-12 VITALS — BP 118/72 | HR 64 | Temp 98.1°F | Ht 73.0 in | Wt 206.0 lb

## 2017-02-12 VITALS — BP 120/72 | HR 70 | Ht 73.0 in | Wt 206.0 lb

## 2017-02-12 DIAGNOSIS — G4733 Obstructive sleep apnea (adult) (pediatric): Secondary | ICD-10-CM

## 2017-02-12 DIAGNOSIS — F331 Major depressive disorder, recurrent, moderate: Secondary | ICD-10-CM | POA: Diagnosis not present

## 2017-02-12 DIAGNOSIS — F411 Generalized anxiety disorder: Secondary | ICD-10-CM | POA: Diagnosis not present

## 2017-02-12 DIAGNOSIS — F325 Major depressive disorder, single episode, in full remission: Secondary | ICD-10-CM | POA: Insufficient documentation

## 2017-02-12 MED ORDER — LORAZEPAM 0.5 MG PO TABS: 0.5000 mg | ORAL_TABLET | Freq: Two times a day (BID) | ORAL | 0 refills | Status: DC | PRN

## 2017-02-12 MED ORDER — SERTRALINE HCL 100 MG PO TABS: 100.0000 mg | ORAL_TABLET | Freq: Every day | ORAL | 3 refills | Status: DC

## 2017-02-12 NOTE — Patient Instructions (Signed)
Schedule home sleep study. Based on this, we will get you started on a CPAP machine

## 2017-02-12 NOTE — Assessment & Plan Note (Addendum)
On his prior sleep study, this was predominantly in the supine position but now he has gained about 10 pounds and  Symptoms are worse  Schedule home sleep study. Based on this, we will get you started on a CPAP machine, will likely need auto CPAP with nasal pillows/mask  Has failed oral appliance therapy in the past

## 2017-02-12 NOTE — Assessment & Plan Note (Signed)
See GAD section

## 2017-02-12 NOTE — Addendum Note (Signed)
Addended by: Marin Olp on: 02/12/2017 12:58 PM   Modules accepted: Level of Service

## 2017-02-12 NOTE — Progress Notes (Signed)
   Subjective:    Patient ID: Thomas Duran, male    DOB: [birth year], 41 y.o.   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 He was seen in 12/2014 last, sleep study from 2013 had shown predominantly events in the supine position after 40/hour with average of 15/hour he also had confounding issues of low testosterone.Marland Kitchen  He was therefore advised positional therapy  He is now back complaining of persistent loud snoring and witnessed apneas by his wife he continues to have excessive daytime fatigue, denies any problems driving. His sleep habits are otherwise unchanged, occasionally reports gasping episodes that awoken him up from sleep.  Denies excessive use of caffeinated beverages or energy drinks. He  has been started on medications for anxiety and depression  His father-in-law works as a Neurosurgeon and he has tried a custom made mouth: The past which did not seem to affect his snoring for his symptoms of fatigue  Significant tests/ events reviewed  Home sleep study  2013-  [facility]  15/ hour on average  Past Medical History:  Diagnosis Date  . Generalized anxiety disorder   . GERD (gastroesophageal reflux disease)   . Hypogonadism in male    Managed by endo - Dr. Buddy Duty     Review of Systems neg for any significant sore throat, dysphagia, itching, sneezing, nasal congestion or excess/ purulent secretions, fever, chills, sweats, unintended wt loss, pleuritic or exertional cp, hempoptysis, orthopnea pnd or change in chronic leg swelling.   Also denies presyncope, palpitations, heartburn, abdominal pain, nausea, vomiting, diarrhea or change in bowel or urinary habits, dysuria,hematuria, rash, arthralgias, visual complaints, headache, numbness weakness or ataxia.     Objective:   Physical Exam  Gen. Pleasant, well-nourished, in no distress ENT - low set jaw, no post nasal drip, class II airway Neck: No JVD, no  thyromegaly, no carotid bruits Lungs: no use of accessory muscles, no dullness to percussion, clear without rales or rhonchi  Cardiovascular: Rhythm regular, heart sounds  normal, no murmurs or gallops, no peripheral edema Musculoskeletal: No deformities, no cyanosis or clubbing        Assessment & Plan:

## 2017-02-12 NOTE — Progress Notes (Signed)
Subjective:  Thomas Duran is a 41 y.o. year old very pleasant male patient who presents for/with See problem oriented charting ROS- no SI. Admits to anxiety, depressed mood, high stress.    Past Medical History-  Patient Active Problem List   Diagnosis Date Noted  . Hyperlipidemia 03/14/2014    Priority: Medium  . OSA (obstructive sleep apnea) 11/08/2011    Priority: Medium  . GAD (generalized anxiety disorder) 02/13/2011    Priority: Medium  . Gastroesophageal reflux disease 02/13/2011    Priority: Medium  . Hypogonadism male 02/13/2011    Priority: Medium  . Chronic cough 06/29/2014    Priority: Low  . MRSA infection 03/29/2011    Priority: Low    Medications- reviewed and updated Current Outpatient Prescriptions  Medication Sig Dispense Refill  . naproxen (NAPROSYN) 500 MG tablet Take 1 tablet (500 mg total) by mouth 2 (two) times daily. 30 tablet 0  . ranitidine (ZANTAC) 150 MG tablet Take 150 mg by mouth 2 (two) times daily.     No current facility-administered medications for this visit.     Objective: BP 118/72 (BP Location: Left Arm, Patient Position: Sitting, Cuff Size: Large)   Pulse 64   Temp 98.1 F (36.7 C) (Oral)   Ht 6\' 1"  (1.854 m)   Wt 206 lb (93.4 kg)   SpO2 97%   BMI 27.18 kg/m  Gen: NAD, resting comfortably  Assessment/Plan:  GAD (generalized anxiety disorder) Depression (moderate recurrent) S:  Patient had weaned off of zoloft after switching to new job with [name]- informed me of this early September. Had been off for 3 months after previously being on zoloft up to 100mg . He was to monitor for symptoms  Seen in ED on 02/02/17 for atypical chest pain. Improved with GI cocktail. EKG with sinus bradycardia. Given toradol as well. Patient wonders about anxiety as cause actually  Last few weeks have been worse with anxiety. Has some changes at work, mom has been in hospital- AVMs in brain and not going as well- several additive  factors. Decreased focus, decreased energy yet anxious at same time.   phq9 of 16, gad7 of 18 A/P: Patient with worsened GAD as primary issue but appears to have moderate depression as well. We discussed counseling and restart zoloft- he is at least on board with zoloft restart- will start 50mg  and titrate quickly to 100mg  with 3 week follow up. SI discussion was had about risks with antidepressants- he is well aware with brother that committed suicide- agrees to call wife and contact us if this happens  Short term ativan (no driving for 8 hours) to help with short term increase in anxiety which has occurred in past when he started SSRI/antidepressant  Future Appointments Date Time Provider [place]  03/14/2017 2:45 PM Marin Olp, MD [facility] None    Meds ordered this encounter  Medications  . sertraline (ZOLOFT) 100 MG tablet    Sig: Take 1 tablet (100 mg total) by mouth daily.    Dispense:  90 tablet    Refill:  3  . LORazepam (ATIVAN) 0.5 MG tablet    Sig: Take 1 tablet (0.5 mg total) by mouth 2 (two) times daily as needed for anxiety.    Dispense:  12 tablet    Refill:  0  depression is new acute condition not previously documented. GAD worsening established  Return precautions advised.  Garret Reddish, MD

## 2017-02-12 NOTE — Patient Instructions (Signed)
Restart sertraline 100mg . For first 4 days take half tablet then may take full tablet  Can use ativan/lorazepam sparingly for increased anxiety in first week or two of taking medicine.   Follow up 3-4 weeks  Consider counseling as well  Taking the medicine as directed and not missing any doses is one of the best things you can do to treat your depression and anxiety.  Here are some things to keep in mind:  1) Side effects (stomach upset, some increased anxiety) may happen before you notice a benefit.  These side effects typically go away over time. 2) Changes to your dose of medicine or a change in medication all together is sometimes necessary 3) Most people need to be on medication at least 6-12 months. In your case, likely long term due to recurrence off medicine.  4) Many people will notice an improvement within two weeks but the full effect of the medication can take up to 4-6 weeks 5) Stopping the medication when you start feeling better often results in a return of symptoms 6) If you start having thoughts of hurting yourself or others after starting this medicine, call our office immediately at [phone] or seek care through 911.

## 2017-02-12 NOTE — Assessment & Plan Note (Addendum)
Depression (moderate recurrent) S:  Patient had weaned off of zoloft after switching to new job with [name]- informed me of this early September. Had been off for 3 months after previously being on zoloft up to 100mg . He was to monitor for symptoms  Seen in ED on 02/02/17 for atypical chest pain. Improved with GI cocktail. EKG with sinus bradycardia. Given toradol as well. Patient wonders about anxiety as cause actually  Last few weeks have been worse with anxiety. Has some changes at work, mom has been in hospital- AVMs in brain and not going as well- several additive factors. Decreased focus, decreased energy yet anxious at same time.   phq9 of 16, gad7 of 18 A/P: Patient with worsened GAD as primary issue but appears to have moderate depression as well. We discussed counseling and restart zoloft- he is at least on board with zoloft restart- will start 50mg  and titrate quickly to 100mg  with 3 week follow up. SI discussion was had about risks with antidepressants- he is well aware with brother that committed suicide- agrees to call wife and contact us if this happens  Short term ativan (no driving for 8 hours) to help with short term increase in anxiety which has occurred in past when he started SSRI/antidepressant

## 2017-02-27 DIAGNOSIS — G4733 Obstructive sleep apnea (adult) (pediatric): Secondary | ICD-10-CM | POA: Diagnosis not present

## 2017-02-28 ENCOUNTER — Telehealth: Payer: Self-pay | Admitting: Pulmonary Disease

## 2017-02-28 DIAGNOSIS — G4733 Obstructive sleep apnea (adult) (pediatric): Secondary | ICD-10-CM | POA: Diagnosis not present

## 2017-02-28 NOTE — Telephone Encounter (Signed)
Per RA, HST showed moderate OSA with 18 events per hour. Recommends an order for an auto CPAP 5-12cm, nasal pillows, humidity. DL and F/U in 4 weeks.

## 2017-03-04 ENCOUNTER — Other Ambulatory Visit: Payer: Self-pay | Admitting: *Deleted

## 2017-03-04 DIAGNOSIS — G4733 Obstructive sleep apnea (adult) (pediatric): Secondary | ICD-10-CM

## 2017-03-14 ENCOUNTER — Ambulatory Visit: Payer: BLUE CROSS/BLUE SHIELD | Admitting: Family Medicine

## 2017-03-14 ENCOUNTER — Encounter: Payer: Self-pay | Admitting: Family Medicine

## 2017-03-14 VITALS — BP 106/72 | HR 82 | Temp 98.4°F | Ht 73.0 in | Wt 201.0 lb

## 2017-03-14 DIAGNOSIS — F331 Major depressive disorder, recurrent, moderate: Secondary | ICD-10-CM | POA: Diagnosis not present

## 2017-03-14 DIAGNOSIS — F411 Generalized anxiety disorder: Secondary | ICD-10-CM | POA: Diagnosis not present

## 2017-03-14 MED ORDER — LORAZEPAM 0.5 MG PO TABS: 0.5000 mg | ORAL_TABLET | Freq: Two times a day (BID) | ORAL | 0 refills | Status: DC | PRN

## 2017-03-14 NOTE — Assessment & Plan Note (Signed)
S:  Patient was restarted on zoloft 100mg  a month ago for both depression and GAD.  He had prior been on zoloft and he had stopped 3 months prior. Since then he had Several stressors last visit- mom had been sick with AVMs in brain- luckily that has stabilized some- she has radiation treatment planned in January. He had some tough challenges at work too.   Since he started the zoloft- several side effects. Low appetite, looser stools on zoloft. Waking up in mornings with pretty intense anxiety, even had some crying spells Has not had thoughts of self harm but states has had thoughts that he understands how others may have felt like that with the more intense anxiety periods (that is why he listed #9 as a 1)- never wanted to harm himself and never had a plan.    Switched the zoloft to evening first of the week because was coming home feeling like he was crashing- felt pretty good in afternoon. . Last 2 days have been better. Anxiety finally starting to decrease. Has taken 10/12 ativan- was taking in the morning with more intense periods of anxiety.   A/P: PHQ9 at last visit of 16 now down to 13. Gad7 at last visit of 18 now down to 16. Depression and GAD with Mild improvements just 3 weeks in- suspect he had worsening anxiety due to zoloft at first which is now improving. We opted to see how he does over next 3-5 weeks- if fails to improve-lexapro back up option next visit. Did give another rx for 0.5mg  ativan #12.

## 2017-03-14 NOTE — Assessment & Plan Note (Signed)
Once again see GAD section

## 2017-03-14 NOTE — Patient Instructions (Signed)
Continue sertraline 100mg .   Can use ativan/lorazepam sparingly for increased anxiety in next few weeks but the hope is once we have zoloft in system for 6 weeks that symptoms such as stomach upset and anxiety will improve  See me either week before Christmas or week after.     From last time:  Consider counseling as well  Taking the medicine as directed and not missing any doses is one of the best things you can do to treat your depression and anxiety.  Here are some things to keep in mind:  1) Side effects (stomach upset, some increased anxiety) may happen before you notice a benefit.  These side effects typically go away over time. 2) Changes to your dose of medicine or a change in medication all together is sometimes necessary 3) Most people need to be on medication at least 6-12 months. In your case, likely long term due to recurrence off medicine.  4) Many people will notice an improvement within two weeks but the full effect of the medication can take up to 4-6 weeks 5) Stopping the medication when you start feeling better often results in a return of symptoms 6) If you start having thoughts of hurting yourself or others after starting this medicine, call our office immediately at [phone] or seek care through 911.

## 2017-03-14 NOTE — Progress Notes (Signed)
Subjective:  Thomas Duran is a 41 y.o. year old very pleasant male patient who presents for/with See problem oriented charting ROS- admits to increased anxiety in AM but better in day. No chest pain or shortness of breaht. No edema. No SI.    Past Medical History-  Patient Active Problem List   Diagnosis Date Noted  . Moderate recurrent major depression ([place]) 02/12/2017    Priority: Medium  . Hyperlipidemia 03/14/2014    Priority: Medium  . OSA (obstructive sleep apnea) 11/08/2011    Priority: Medium  . GAD (generalized anxiety disorder) 02/13/2011    Priority: Medium  . Gastroesophageal reflux disease 02/13/2011    Priority: Medium  . Hypogonadism male 02/13/2011    Priority: Medium  . Chronic cough 06/29/2014    Priority: Low  . MRSA infection 03/29/2011    Priority: Low    Medications- reviewed and updated Current Outpatient Medications  Medication Sig Dispense Refill  . LORazepam (ATIVAN) 0.5 MG tablet Take 1 tablet (0.5 mg total) by mouth 2 (two) times daily as needed for anxiety. 12 tablet 0  . ranitidine (ZANTAC) 150 MG tablet Take 150 mg by mouth 2 (two) times daily.    . sertraline (ZOLOFT) 100 MG tablet Take 1 tablet (100 mg total) by mouth daily. 90 tablet 3   No current facility-administered medications for this visit.     Objective: BP 106/72 (BP Location: Left Arm, Patient Position: Sitting, Cuff Size: Large)   Pulse 82   Temp 98.4 F (36.9 C) (Oral)   Ht 6\' 1"  (1.854 m)   Wt 201 lb (91.2 kg)   SpO2 96%   BMI 26.52 kg/m  Gen: NAD, resting comfortably CV: RRR no murmurs rubs or gallops Lungs: CTAB no crackles, wheeze, rhonchi Ext: no edema Skin: warm, dry, no rash  Assessment/Plan:  GAD (generalized anxiety disorder) S:  Patient was restarted on zoloft 100mg  a month ago for both depression and GAD.  He had prior been on zoloft and he had stopped 3 months prior. Since then he had Several stressors last visit- mom had been sick with AVMs in  brain- luckily that has stabilized some- she has radiation treatment planned in January. He had some tough challenges at work too.   Since he started the zoloft- several side effects. Low appetite, looser stools on zoloft. Waking up in mornings with pretty intense anxiety, even had some crying spells Has not had thoughts of self harm but states has had thoughts that he understands how others may have felt like that with the more intense anxiety periods (that is why he listed #9 as a 1)- never wanted to harm himself and never had a plan.    Switched the zoloft to evening first of the week because was coming home feeling like he was crashing- felt pretty good in afternoon. . Last 2 days have been better. Anxiety finally starting to decrease. Has taken 10/12 ativan- was taking in the morning with more intense periods of anxiety.   A/P: PHQ9 at last visit of 16 now down to 13. Gad7 at last visit of 18 now down to 16. Depression and GAD with Mild improvements just 3 weeks in- suspect he had worsening anxiety due to zoloft at first which is now improving. We opted to see how he does over next 3-5 weeks- if fails to improve-lexapro back up option next visit. Did give another rx for 0.5mg  ativan #12.   Moderate recurrent major depression ([place]) Once again see  GAD section   Future Appointments  Date Time Provider [place]  03/31/2017  2:45 PM Marin Olp, MD [facility]   Meds ordered this encounter  Medications  . LORazepam (ATIVAN) 0.5 MG tablet    Sig: Take 1 tablet (0.5 mg total) by mouth 2 (two) times daily as needed for anxiety.    Dispense:  12 tablet    Refill:  0    Return precautions advised.  Garret Reddish, MD

## 2017-03-21 ENCOUNTER — Telehealth: Payer: Self-pay | Admitting: Pulmonary Disease

## 2017-03-21 DIAGNOSIS — G4733 Obstructive sleep apnea (adult) (pediatric): Secondary | ICD-10-CM

## 2017-03-21 NOTE — Telephone Encounter (Signed)
Spoke with pt letting him know the results of the HST. Told pt recommendations of starting him on CPAP. Pt expressed understanding. Order placed for him to start CPAP and scheduled an OV with RA for a follow-up after CPAP. Nothing further needed.

## 2017-03-31 ENCOUNTER — Ambulatory Visit: Payer: BLUE CROSS/BLUE SHIELD | Admitting: Family Medicine

## 2017-03-31 ENCOUNTER — Encounter: Payer: Self-pay | Admitting: Family Medicine

## 2017-03-31 VITALS — BP 108/68 | HR 83 | Temp 98.6°F | Ht 73.0 in | Wt 203.0 lb

## 2017-03-31 DIAGNOSIS — F411 Generalized anxiety disorder: Secondary | ICD-10-CM

## 2017-03-31 DIAGNOSIS — F331 Major depressive disorder, recurrent, moderate: Secondary | ICD-10-CM

## 2017-03-31 MED ORDER — ESCITALOPRAM OXALATE 20 MG PO TABS: 20.0000 mg | ORAL_TABLET | Freq: Every day | ORAL | 5 refills | Status: DC

## 2017-03-31 NOTE — Patient Instructions (Addendum)
Stop zoloft- last dose would have been last night  Start escitalopram/lexapro 20mg  tablet instead  Half tablet each night for a week then go to a full tablet  See me back in 6 weeks unless not tolerating it at all  Once again- call us immediately if thoughts of self harm or call 911  I think you will do well with lexapro/escitalopram but if you have time also do some reading on pristiq and abilify  Aripiprazole tablets What is this medicine? ARIPIPRAZOLE (ay ri PIP ray zole) is an atypical antipsychotic. It is used to treat schizophrenia and bipolar disorder, also known as manic-depression. It is also used to treat Tourette's disorder and some symptoms of autism. This medicine may also be used in combination with antidepressants to treat major depressive disorder. This medicine may be used for other purposes; ask your health care provider or pharmacist if you have questions. COMMON BRAND NAME(S): Abilify What should I tell my health care provider before I take this medicine? They need to know if you have any of these conditions: -dehydration -dementia -diabetes -heart disease -history of stroke -low blood counts, like low white cell, platelet, or red cell counts -Parkinson's disease -seizures -suicidal thoughts, plans, or attempt; a previous suicide attempt by you or a family member -an unusual or allergic reaction to aripiprazole, other medicines, foods, dyes, or preservatives -pregnant or trying to get pregnant -breast-feeding How should I use this medicine? Take this medicine by mouth with a glass of water. Follow the directions on the prescription label. You can take this medicine with or without food. Take your doses at regular intervals. Do not take your medicine more often than directed. Do not stop taking except on the advice of your doctor or health care professional. A special MedGuide will be given to you by the pharmacist with each prescription and refill. Be sure to read  this information carefully each time. Talk to your pediatrician regarding the use of this medicine in children. While this drug may be prescribed for children as young as 49 years of age for selected conditions, precautions do apply. Overdosage: If you think you have taken too much of this medicine contact a poison control center or emergency room at once. NOTE: This medicine is only for you. Do not share this medicine with others. What if I miss a dose? If you miss a dose, take it as soon as you can. If it is almost time for your next dose, take only that dose. Do not take double or extra doses. What may interact with this medicine? Do not take this medicine with any of the following medications: -brexpiprazole -cisapride -dofetilide -dronedarone -metoclopramide -pimozide -thioridazine This medicine may also interact with the following medications: -alcohol -carbamazepine -certain medicines for anxiety or sleep -certain medicines for blood pressure -certain medicines for fungal infections like ketoconazole, fluconazole, posaconazole, and itraconazole -clarithromycin -fluoxetine -other medicines that prolong the QT interval (cause an abnormal heart rhythm) -paroxetine -quinidine -rifampin This list may not describe all possible interactions. Give your health care provider a list of all the medicines, herbs, non-prescription drugs, or dietary supplements you use. Also tell them if you smoke, drink alcohol, or use illegal drugs. Some items may interact with your medicine. What should I watch for while using this medicine? Visit your doctor or health care professional for regular checks on your progress. It may be several weeks before you see the full effects of this medicine. Do not suddenly stop taking this medicine. You  may need to gradually reduce the dose. Patients and their families should watch out for worsening depression or thoughts of suicide. Also watch out for sudden changes in  feelings such as feeling anxious, agitated, panicky, irritable, hostile, aggressive, impulsive, severely restless, overly excited and hyperactive, or not being able to sleep. If this happens, especially at the beginning of antidepressant treatment or after a change in dose, call your health care professional. Dennis Bast may get dizzy or drowsy. Do not drive, use machinery, or do anything that needs mental alertness until you know how this medicine affects you. Do not stand or sit up quickly, especially if you are an older patient. This reduces the risk of dizzy or fainting spells. Alcohol can increase dizziness and drowsiness. Avoid alcoholic drinks. This medicine can reduce the response of your body to heat or cold. Dress warm in cold weather and stay hydrated in hot weather. If possible, avoid extreme temperatures like saunas, hot tubs, very hot or cold showers, or activities that can cause dehydration such as vigorous exercise. This medicine may cause dry eyes and blurred vision. If you wear contact lenses you may feel some discomfort. Lubricating drops may help. See your eye doctor if the problem does not go away or is severe. If you notice an increased hunger or thirst, different from your normal hunger or thirst, or if you find that you have to urinate more frequently, you should contact your health care provider as soon as possible. You may need to have your blood sugar monitored. This medicine may cause changes in your blood sugar levels. You should monitor you blood sugar frequently if you have diabetes. There have been reports of uncontrollable and strong urges to gamble, binge eat, shop, and have sex while taking this medicine. If you experience any of these or other uncontrollable and strong urges while taking this medicine, you should report it to your health care provider as soon as possible. What side effects may I notice from receiving this medicine? Side effects that you should report to your doctor  or health care professional as soon as possible: -allergic reactions like skin rash, itching or hives, swelling of the face, lips, or tongue -breathing problems -confusion -feeling faint or lightheaded, falls -fever or chills, sore throat -increased hunger or thirst -increased urination -joint pain -muscles pain, spasms -problems with balance, talking, walking -restlessness or need to keep moving -seizures -suicidal thoughts or other mood changes -trouble swallowing -uncontrollable and excessive urges (examples: gambling, binge eating, shopping, having sex) -uncontrollable head, mouth, neck, arm, or leg movements -unusually weak or tired Side effects that usually do not require medical attention (report to your doctor or health care professional if they continue or are bothersome): -blurred vision -constipation -headache -nausea, vomiting -trouble sleeping -weight gain This list may not describe all possible side effects. Call your doctor for medical advice about side effects. You may report side effects to FDA at 1-800-FDA-1088. Where should I keep my medicine? Keep out of the reach of children. Store at room temperature between 15 and 30 degrees C (59 and 86 degrees F). Throw away any unused medicine after the expiration date. NOTE: This sheet is a summary. It may not cover all possible information. If you have questions about this medicine, talk to your doctor, pharmacist, or health care provider.  2018 Elsevier/Gold Standard (2016-03-17 11:45:05)  Desvenlafaxine extended-release tablets What is this medicine? DESVENLAFAXINE (des VEN la [employer]) is used to treat depression. This medicine may be used for  other purposes; ask your health care provider or pharmacist if you have questions. COMMON BRAND NAME(S): Marshall Cork, Pristiq What should I tell my health care provider before I take this medicine? They need to know if you have any of these conditions: -glaucoma -high blood  pressure -kidney disease -liver disease -mania or bipolar disorder -suicidal thoughts or a previous suicide attempt -an unusual reaction to desvenlafaxine, venlafaxine, other medicines, foods, dyes, or preservatives -pregnant or trying to get pregnant -breast-feeding How should I use this medicine? Take this medicine by mouth with a drink of water. Do not crush, cut or chew. Follow the directions on the prescription label. Take your doses at regular intervals. Do not take your medicine more often than directed. Do not stop taking this medicine suddenly except upon the advice of your doctor. Stopping this medicine too quickly may cause serious side effects or your condition may worsen. Contact your pediatrician or health care professional regarding the use of this medicine in children. Special care may be needed. A special MedGuide will be given to you by the pharmacist with each prescription and refill. Be sure to read this information carefully each time. Overdosage: If you think you have taken too much of this medicine contact a poison control center or emergency room at once. NOTE: This medicine is only for you. Do not share this medicine with others. What if I miss a dose? If you miss a dose, take it as soon as you can. If it is almost time for your next dose, take only that dose. Do not take double or extra doses. What may interact with this medicine? Do not take this medicine with any of the following medications: -duloxetine -levomilnacipran -linezolid -MAOIs like Carbex, Eldepryl, Marplan, Nardil, and Parnate -methylene blue (injected into a vein) -milnacipran -venlafaxine This medicine may also interact with the following medications: -alcohol -amphetamines -aspirin and aspirin-like medicines -certain migraine headache medicines (almotriptan, eletriptan, frovatriptan, naratriptan, rizatriptan, sumatriptan, zolmitriptan) -dexfenfluramine or  fenfluramine -furazolidone -isoniazid -lithium -medicines for heart rhythm or blood pressure -medicines that treat or prevent blood clots like warfarin, enoxaparin, and dalteparin -methylphenidate -metoclopramide -NSAIDS, medicines for pain and inflammation, like ibuprofen or naproxen -pentazocine -phentermine -procarbazine -protriptyline -rasagiline -sibutramine -St. John's wort, Hypericum perforatum -tramadol -tryptophan -zolpidem This list may not describe all possible interactions. Give your health care provider a list of all the medicines, herbs, non-prescription drugs, or dietary supplements you use. Also tell them if you smoke, drink alcohol, or use illegal drugs. Some items may interact with your medicine. What should I watch for while using this medicine? Tell your doctor if your symptoms do not get better or if they get worse. Visit your doctor or health care professional for regular checks on your progress. Because it may take several weeks to see the full effects of this medicine, it is important to continue your treatment as prescribed by your doctor. Patients and their families should watch out for new or worsening thoughts of suicide or depression. Also watch out for sudden changes in feelings such as feeling anxious, agitated, panicky, irritable, hostile, aggressive, impulsive, severely restless, overly excited and hyperactive, or not being able to sleep. If this happens, especially at the beginning of treatment or after a change in dose, call your health care professional. This medicine can cause an increase in blood pressure. Check with your doctor for instructions on monitoring your blood pressure while taking this medicine. You may get drowsy or dizzy. Do not drive, use machinery, or do anything  that needs mental alertness until you know how this medicine affects you. Do not stand or sit up quickly, especially if you are an older patient. This reduces the risk of dizzy or  fainting spells. Alcohol may interfere with the effect of this medicine. Avoid alcoholic drinks. Your mouth may get dry. Chewing sugarless gum, sucking hard candy and drinking plenty of water will help. Contact your doctor if the problem does not go away or is severe. What side effects may I notice from receiving this medicine? Side effects that you should report to your doctor or health care professional as soon as possible: -allergic reactions like skin rash, itching or hives, swelling of the face, lips, or tongue -anxious -breathing problems -confusion -changes in vision -chest pain -confusion -elevated mood, decreased need for sleep, racing thoughts, impulsive behavior -eye pain -fast, irregular heartbeat -feeling faint or lightheaded, falls -feeling agitated, angry, or irritable -hallucination, loss of contact with reality -high blood pressure -loss of balance or coordination -palpitations -redness, blistering, peeling or loosening of the skin, including inside the mouth -restlessness, pacing, inability to keep still -seizures -stiff muscles -suicidal thoughts or other mood changes -trouble passing urine or change in the amount of urine -trouble sleeping -unusual bleeding or bruising -unusually weak or tired -vomiting Side effects that usually do not require medical attention (report to your doctor or health care professional if they continue or are bothersome): -change in sex drive or performance -change in appetite or weight -constipation -dizziness -dry mouth -headache -increased sweating -nausea -tired This list may not describe all possible side effects. Call your doctor for medical advice about side effects. You may report side effects to FDA at 1-800-FDA-1088. Where should I keep my medicine? Keep out of the reach of children. Store at room temperature between 15 and 30 degrees C (59 and 86 degrees F). Throw away any unused medicine after the expiration  date. NOTE: This sheet is a summary. It may not cover all possible information. If you have questions about this medicine, talk to your doctor, pharmacist, or health care provider.  2018 Elsevier/Gold Standard (2015-08-31 17:35:48)

## 2017-03-31 NOTE — Assessment & Plan Note (Signed)
See depression section.   As reminder Cymbalta, prozac not effective effexor- bad withdrawals  Counseling- has not been effective  Has not tried pristiq or abilify

## 2017-03-31 NOTE — Progress Notes (Signed)
Subjective:  Thomas Duran is a 41 y.o. year old very pleasant male patient who presents for/with See problem oriented charting ROS- admits to anxiety, insomnia, depressed mood. No SI.    Past Medical History-  Patient Active Problem List   Diagnosis Date Noted  . Moderate recurrent major depression ([place]) 02/12/2017    Priority: High  . Hyperlipidemia 03/14/2014    Priority: Medium  . OSA (obstructive sleep apnea) 11/08/2011    Priority: Medium  . GAD (generalized anxiety disorder) 02/13/2011    Priority: Medium  . Gastroesophageal reflux disease 02/13/2011    Priority: Medium  . Hypogonadism male 02/13/2011    Priority: Medium  . Chronic cough 06/29/2014    Priority: Low  . MRSA infection 03/29/2011    Priority: Low    Medications- reviewed and updated Current Outpatient Medications  Medication Sig Dispense Refill  . ranitidine (ZANTAC) 150 MG tablet Take 150 mg by mouth 2 (two) times daily.    . sertraline (ZOLOFT) 100 MG tablet Take 1 tablet (100 mg total) by mouth daily. 90 tablet 3  . LORazepam (ATIVAN) 0.5 MG tablet Take 1 tablet (0.5 mg total) by mouth 2 (two) times daily as needed for anxiety. (Patient not taking: Reported on 03/31/2017) 12 tablet 0   Objective: BP 108/68 (BP Location: Left Arm, Patient Position: Sitting, Cuff Size: Large)   Pulse 83   Temp 98.6 F (37 C) (Oral)   Ht 6\' 1"  (1.854 m)   Wt 203 lb (92.1 kg)   SpO2 96%   BMI 26.78 kg/m  Gen: NAD, resting comfortably  Assessment/Plan:  Moderate recurrent major depression ([facility]) S:On zoloft 100mg  and has not used ativan since last visit PHQ9 today at 13 from peak of 16 GAD7 today 12 from peak of 18  He reports modest improvements on zoloft 100mg . On other hand also having anxiety still and states has more insomnia (wakes at 3 am and hard to get back to sleep than he had before medication. Also having pretty regular nausea  and decreased intake. Some achiness in arms in legs and feels tired by  the PM- thinks all of these have started with zoloft. We used zoloft because he had tolerated in the past.  A/P: Poor control on zoloft 100mg  with SE of nausea and insomnia despite 6 week trial. We discussed changing to lexapro 10mg  for a week then go to 20mg . We also discussed back up options of adding abilify or changing to pristiq- he will read over these options and return in 6 weeks. No SI and warned of those precautions yet again (sucidal risk)  GAD (generalized anxiety disorder) See depression section.   As reminder Cymbalta, prozac not effective effexor- bad withdrawals  Counseling- has not been effective  Has not tried pristiq or abilify   Future Appointments  Date Time Provider [place]  04/30/2017 11:45 AM Rigoberto Noel, MD [facility] None   Meds ordered this encounter  Medications  . escitalopram (LEXAPRO) 20 MG tablet    Sig: Take 1 tablet (20 mg total) by mouth daily.    Dispense:  30 tablet    Refill:  5    Return precautions advised.  Garret Reddish, MD

## 2017-03-31 NOTE — Assessment & Plan Note (Signed)
S:On zoloft 100mg  and has not used ativan since last visit PHQ9 today at 13 from peak of 16 GAD7 today 12 from peak of 18  He reports modest improvements on zoloft 100mg . On other hand also having anxiety still and states has more insomnia (wakes at 3 am and hard to get back to sleep than he had before medication. Also having pretty regular nausea  and decreased intake. Some achiness in arms in legs and feels tired by the PM- thinks all of these have started with zoloft. We used zoloft because he had tolerated in the past.  A/P: Poor control on zoloft 100mg  with SE of nausea and insomnia despite 6 week trial. We discussed changing to lexapro 10mg  for a week then go to 20mg . We also discussed back up options of adding abilify or changing to pristiq- he will read over these options and return in 6 weeks. No SI and warned of those precautions yet again (sucidal risk)

## 2017-04-03 DIAGNOSIS — M9901 Segmental and somatic dysfunction of cervical region: Secondary | ICD-10-CM | POA: Diagnosis not present

## 2017-04-22 DIAGNOSIS — G4733 Obstructive sleep apnea (adult) (pediatric): Secondary | ICD-10-CM | POA: Diagnosis not present

## 2017-04-30 ENCOUNTER — Encounter: Payer: Self-pay | Admitting: Pulmonary Disease

## 2017-04-30 ENCOUNTER — Ambulatory Visit: Payer: BLUE CROSS/BLUE SHIELD | Admitting: Pulmonary Disease

## 2017-04-30 DIAGNOSIS — G4733 Obstructive sleep apnea (adult) (pediatric): Secondary | ICD-10-CM

## 2017-04-30 NOTE — Progress Notes (Signed)
   Subjective:    Patient ID: Thomas Duran, male    DOB: Jan 06, 1976, 42 y.o.   MRN: [id number]  HPI  42 year old with moderate OSA. He has been diagnosed in 2013 and failed oral appliance. This time he was started on CPAP with nasal pillows with auto settings 5-12 cm and seems to have done well since then. He has been using for about a week with excellent compliance, average pressure is 8 cm with minimal leak. He has mild irritation around his nares and Vaseline seems to work. He has noted some improvement in his daytime somnolence and fatigue and of course snoring has been abolished  Significant tests/ events reviewed  03/2017 HST mod, AHI 18/h Home sleep study  2013-  AHI  15/ hour on average  Review of Systems Patient denies significant dyspnea,cough, hemoptysis,  chest pain, palpitations, pedal edema, orthopnea, paroxysmal nocturnal dyspnea, lightheadedness, nausea, vomiting, abdominal or  leg pains      Objective:   Physical Exam  Gen. Pleasant, well-nourished, in no distress ENT - no thrush, no post nasal drip Neck: No JVD, no thyromegaly, no carotid bruits Lungs: no use of accessory muscles, no dullness to percussion, clear without rales or rhonchi  Cardiovascular: Rhythm regular, heart sounds  normal, no murmurs or gallops, no peripheral edema Musculoskeletal: No deformities, no cyanosis or clubbing        Assessment & Plan:

## 2017-04-30 NOTE — Assessment & Plan Note (Signed)
CPAP is working well, average pressure is 8 cm. Can change to nasal mask if you have excessive irritation Continue auto CPAP settings  , compliance with goal of at least 4-6 hrs every night is the expectation. Advised against medications with sedative side effects Cautioned against driving when sleepy - understanding that sleepiness will vary on a day to day basis

## 2017-04-30 NOTE — Patient Instructions (Addendum)
CPAP is working well, average pressure is 8 cm. Can change to nasal mask if you have excessive irritation Call us for issues

## 2017-05-23 DIAGNOSIS — G4733 Obstructive sleep apnea (adult) (pediatric): Secondary | ICD-10-CM | POA: Diagnosis not present

## 2017-06-20 DIAGNOSIS — G4733 Obstructive sleep apnea (adult) (pediatric): Secondary | ICD-10-CM | POA: Diagnosis not present

## 2017-07-02 ENCOUNTER — Other Ambulatory Visit: Payer: Self-pay

## 2017-07-02 MED ORDER — ESCITALOPRAM OXALATE 20 MG PO TABS: 20.0000 mg | ORAL_TABLET | Freq: Every day | ORAL | 5 refills | Status: DC

## 2017-07-15 DIAGNOSIS — M9901 Segmental and somatic dysfunction of cervical region: Secondary | ICD-10-CM | POA: Diagnosis not present

## 2017-07-21 DIAGNOSIS — G4733 Obstructive sleep apnea (adult) (pediatric): Secondary | ICD-10-CM | POA: Diagnosis not present

## 2017-07-28 ENCOUNTER — Ambulatory Visit: Payer: BLUE CROSS/BLUE SHIELD | Admitting: Adult Health

## 2017-07-28 ENCOUNTER — Encounter: Payer: Self-pay | Admitting: Adult Health

## 2017-07-28 DIAGNOSIS — G4733 Obstructive sleep apnea (adult) (pediatric): Secondary | ICD-10-CM

## 2017-07-28 NOTE — Assessment & Plan Note (Signed)
Compensated on CPAP at bedtime  Plan  Patient Instructions  Keep up the good work Continue on CPAP at bedtime Work  on a healthy weight Do not drive a sleepy Follow-up in 6 months with Dr. Elsworth Soho and as needed

## 2017-07-28 NOTE — Progress Notes (Signed)
@Patient  ID: Thomas Duran, male    DOB: 30-Sep-1975, 42 y.o.   MRN: [id number]  Chief Complaint  Patient presents with  . Follow-up    OSA    Referring provider: Marin Olp, MD  HPI: 42 year old male followed for moderate sleep apnea Patient was initially diagnosed with OSA in 2013 failed oral appliance.  He did try CPAP but was intolerant.  Reestablished with Dr. Elsworth Soho in October 2018.  Home sleep study showed moderate sleep apnea December 2018  TEST  03/2017 HST mod, AHI 18/h Home sleep study 2013- AHI 15/ hour on average   07/28/2017 Follow up ; OSA  Patient returns for a 21-month follow-up.  Patient has underlying sleep apnea.  He is on CPAP at bedtime.  Patient says he is doing well on CPAP.  Patient feels rested and feels that his sleep is better with decreased snoring. Download shows excellent compliance with average usage at 100%.  He is using each night at 7.5 hours.  Patient is on AutoSet 5-12 Summers H2O.  AHI 3.2.  Minimum leaks.  No Known Allergies  Immunization History  Administered Date(s) Administered  . Tdap 10/31/2014    Past Medical History:  Diagnosis Date  . Generalized anxiety disorder   . GERD (gastroesophageal reflux disease)   . Hypogonadism in male    Managed by endo - Dr. Buddy Duty    Tobacco History: Social History   Tobacco Use  Smoking Status Never Smoker  Smokeless Tobacco Never Used   Counseling given: Not Answered   Outpatient Encounter Medications as of 07/28/2017  Medication Sig  . escitalopram (LEXAPRO) 20 MG tablet Take 1 tablet (20 mg total) by mouth daily.  . ranitidine (ZANTAC) 150 MG tablet Take 150 mg by mouth 2 (two) times daily.   No facility-administered encounter medications on file as of 07/28/2017.      Review of Systems  Constitutional:   No  weight loss, night sweats,  Fevers, chills, fatigue, or  lassitude.  HEENT:   No headaches,  Difficulty swallowing,  Tooth/dental problems, or  Sore throat,               No sneezing, itching, ear ache, nasal congestion, post nasal drip,   CV:  No chest pain,  Orthopnea, PND, swelling in lower extremities, anasarca, dizziness, palpitations, syncope.   GI  No heartburn, indigestion, abdominal pain, nausea, vomiting, diarrhea, change in bowel habits, loss of appetite, bloody stools.   Resp: No shortness of breath with exertion or at rest.  No excess mucus, no productive cough,  No non-productive cough,  No coughing up of blood.  No change in color of mucus.  No wheezing.  No chest wall deformity  Skin: no rash or lesions.  GU: no dysuria, change in color of urine, no urgency or frequency.  No flank pain, no hematuria   MS:  No joint pain or swelling.  No decreased range of motion.  No back pain.    Physical Exam  BP 104/64 (BP Location: Left Arm, Cuff Size: Normal)   Pulse 71   Ht 6\' 1"  (1.854 m)   Wt 209 lb (94.8 kg)   SpO2 96%   BMI 27.57 kg/m   GEN: A/Ox3; pleasant , NAD, well nourished    HEENT:  [place]/AT,  EACs-clear, TMs-wnl, NOSE-clear, THROAT-clear, no lesions, no postnasal drip or exudate noted. Class 2 MP airway   NECK:  Supple w/ fair ROM; no JVD; normal carotid impulses w/o bruits; no  thyromegaly or nodules palpated; no lymphadenopathy.    RESP  Clear  P & A; w/o, wheezes/ rales/ or rhonchi. no accessory muscle use, no dullness to percussion  CARD:  RRR, no m/r/g, no peripheral edema, pulses intact, no cyanosis or clubbing.  GI:   Soft & nt; nml bowel sounds; no organomegaly or masses detected.   Musco: Warm bil, no deformities or joint swelling noted.   Neuro: alert, no focal deficits noted.    Skin: Warm, no lesions or rashes    BMET  BNP No results found for: BNP  ProBNP No results found for: PROBNP  Imaging: No results found.   Assessment & Plan:   OSA (obstructive sleep apnea) Compensated on CPAP at bedtime  Plan  Patient Instructions  Keep up the good work Continue on CPAP at bedtime Work  on  a healthy weight Do not drive a sleepy Follow-up in 6 months with Dr. Elsworth Soho and as needed        Rexene Edison, NP 07/28/2017

## 2017-07-28 NOTE — Patient Instructions (Signed)
Keep up the good work Continue on CPAP at bedtime Work  on a healthy weight Do not drive a sleepy Follow-up in 6 months with Dr. Elsworth Soho and as needed

## 2017-08-20 DIAGNOSIS — G4733 Obstructive sleep apnea (adult) (pediatric): Secondary | ICD-10-CM | POA: Diagnosis not present

## 2017-09-20 DIAGNOSIS — G4733 Obstructive sleep apnea (adult) (pediatric): Secondary | ICD-10-CM | POA: Diagnosis not present

## 2017-10-20 DIAGNOSIS — G4733 Obstructive sleep apnea (adult) (pediatric): Secondary | ICD-10-CM | POA: Diagnosis not present

## 2017-11-20 DIAGNOSIS — G4733 Obstructive sleep apnea (adult) (pediatric): Secondary | ICD-10-CM | POA: Diagnosis not present

## 2017-12-21 DIAGNOSIS — G4733 Obstructive sleep apnea (adult) (pediatric): Secondary | ICD-10-CM | POA: Diagnosis not present

## 2018-03-23 DIAGNOSIS — M546 Pain in thoracic spine: Secondary | ICD-10-CM | POA: Diagnosis not present

## 2018-03-23 DIAGNOSIS — M542 Cervicalgia: Secondary | ICD-10-CM | POA: Diagnosis not present

## 2018-03-23 DIAGNOSIS — M9902 Segmental and somatic dysfunction of thoracic region: Secondary | ICD-10-CM | POA: Diagnosis not present

## 2018-03-23 DIAGNOSIS — M9901 Segmental and somatic dysfunction of cervical region: Secondary | ICD-10-CM | POA: Diagnosis not present

## 2018-04-02 DIAGNOSIS — G4733 Obstructive sleep apnea (adult) (pediatric): Secondary | ICD-10-CM | POA: Diagnosis not present

## 2018-04-09 ENCOUNTER — Ambulatory Visit: Payer: BLUE CROSS/BLUE SHIELD | Admitting: Family Medicine

## 2018-04-09 ENCOUNTER — Encounter: Payer: Self-pay | Admitting: Family Medicine

## 2018-04-09 VITALS — BP 122/74 | HR 63 | Temp 98.0°F | Ht 73.0 in | Wt 223.2 lb

## 2018-04-09 DIAGNOSIS — K219 Gastro-esophageal reflux disease without esophagitis: Secondary | ICD-10-CM

## 2018-04-09 DIAGNOSIS — M545 Low back pain, unspecified: Secondary | ICD-10-CM

## 2018-04-09 MED ORDER — CYCLOBENZAPRINE HCL 10 MG PO TABS: 10.0000 mg | ORAL_TABLET | Freq: Three times a day (TID) | ORAL | 0 refills | Status: DC | PRN

## 2018-04-09 MED ORDER — DICLOFENAC SODIUM 75 MG PO TBEC: 75.0000 mg | DELAYED_RELEASE_TABLET | Freq: Two times a day (BID) | ORAL | 0 refills | Status: DC

## 2018-04-09 NOTE — Progress Notes (Signed)
   Subjective:  Thomas Duran is a 42 y.o. male who presents today for same-day appointment with a chief complaint of back pain.   HPI:  Back Pain, Acute problem Started 3 weeks ago.  Stable over that time.  Located in bilateral lower back.  Occasionally radiates into his abdomen.  Worse with going from sitting to standing.  Worse with standing for long periods of time.  No pain at rest.  No pain while sitting. No fevers or chills. No constipation or diarrhea. No urinary symptoms. No weakness or numbness. No treatments tried.  No other obvious alleviating or aggravating factors.  GERD Chronic problem.  Previously on ranitidine 150 mg daily, but has had to stop due to the recall.  Symptoms have been worsened recently.  Occasionally takes Tums which helps.  ROS: Per HPI  PMH: He reports that he has never smoked. He has never used smokeless tobacco. He reports current alcohol use. He reports that he does not use drugs.  Objective:  Physical Exam: BP 122/74 (BP Location: Left Arm, Patient Position: Sitting, Cuff Size: Large)   Pulse 63   Temp 98 F (36.7 C) (Oral)   Ht 6\' 1"  (1.854 m)   Wt 223 lb 4 oz (101.3 kg)   SpO2 97%   BMI 29.45 kg/m   Gen: NAD, resting comfortably CV: RRR with no murmurs appreciated Pulm: NWOB, CTAB with no crackles, wheezes, or rhonchi GI: Normal bowel sounds present. Soft, Nontender, Nondistended. MSK: -Back: No deformities.  Pain is appreciated along bilateral lower lumbar paraspinal muscles. -Lower extremities: No deformities.  Strength 5 out of 5 throughout.  Sensation light touch intact throughout.  Pain elicited with Faber maneuver bilaterally. SLR negative bilaterally.  Neuro: No gross deformities.  Moves all extremities spontaneously.  Assessment/Plan:  Back Pain No red flags. Likely muscular strain. May have some degenerative changes playing a role as well. Start diclofenac 75mg  bid for the next 1 to 2 weeks.  Start Flexeril 10 mg 3 times daily  as needed for muscle spasm.  Recommended heating pad to the area.  Discussed home exercise program and handout was given.  Discussed reasons to return to care and seek emergent care.  Follow-up as needed.  GERD Stable.  Advised patient that he could find non-recalled batches of ranitidine at certain pharmacies.  Also recommended over-the-counter famotidine if he was unable to find ranitidine.  Algis Greenhouse. Jerline Pain, MD 04/09/2018 9:08 AM

## 2018-04-09 NOTE — Patient Instructions (Signed)
It was very nice to see you today!  You have tight muscles in your lower back.  This is causing your symptoms.  Please take the diclofenac 75 mg twice daily for the next 1 to 2 weeks.  Please take the Flexeril 10 mg 3 times daily as needed.  Please work on the exercises and a handout.  You can try taking Pepcid or famotidine instead of ranitidine for your reflux.  Please let me know or let Dr. Yong Channel know if your symptoms worsen or do not improve the next few weeks.  Take care, Dr Jerline Pain

## 2018-05-19 ENCOUNTER — Other Ambulatory Visit: Payer: Self-pay | Admitting: Family Medicine

## 2018-05-21 DIAGNOSIS — M9902 Segmental and somatic dysfunction of thoracic region: Secondary | ICD-10-CM | POA: Diagnosis not present

## 2018-05-21 DIAGNOSIS — M542 Cervicalgia: Secondary | ICD-10-CM | POA: Diagnosis not present

## 2018-05-21 DIAGNOSIS — M9901 Segmental and somatic dysfunction of cervical region: Secondary | ICD-10-CM | POA: Diagnosis not present

## 2018-05-21 DIAGNOSIS — M546 Pain in thoracic spine: Secondary | ICD-10-CM | POA: Diagnosis not present

## 2018-06-04 ENCOUNTER — Encounter: Payer: Self-pay | Admitting: Sports Medicine

## 2018-06-04 ENCOUNTER — Ambulatory Visit: Payer: BLUE CROSS/BLUE SHIELD | Admitting: Sports Medicine

## 2018-06-04 VITALS — BP 106/78 | HR 65 | Ht 73.0 in | Wt 220.2 lb

## 2018-06-04 DIAGNOSIS — M24551 Contracture, right hip: Secondary | ICD-10-CM | POA: Diagnosis not present

## 2018-06-04 DIAGNOSIS — F419 Anxiety disorder, unspecified: Secondary | ICD-10-CM

## 2018-06-04 DIAGNOSIS — M9904 Segmental and somatic dysfunction of sacral region: Secondary | ICD-10-CM | POA: Diagnosis not present

## 2018-06-04 DIAGNOSIS — M9905 Segmental and somatic dysfunction of pelvic region: Secondary | ICD-10-CM

## 2018-06-04 DIAGNOSIS — M9903 Segmental and somatic dysfunction of lumbar region: Secondary | ICD-10-CM | POA: Diagnosis not present

## 2018-06-04 NOTE — Progress Notes (Signed)
Thomas Duran. , [place] [facility] [place] - 43 y.o. male MRN [id number]  Date of birth: [birth year]  Visit Date: June 04, 2018  PCP: Marin Olp, MD   Referred by: Marin Olp, MD  SUBJECTIVE:  Chief Complaint  Patient presents with  . New Patient (Initial Visit)    Low back pain.  Tried voltaren 75 mg and Flexeril 10mg .  Given HEP from [place]    HPI: Patient presents with 3 months of worsening/constant right low back pain that is described as an aching uncomfortable pain.  He was seen in December and started on muscle relaxers and diclofenac and this did not provide any significant benefit.  He has pain that is radiating into his groin bilaterally.  His even into the pudendal region.  He has noticed some increased urinary hesitancy and weak stream but no dysuria or hematuria.  No family history of prostate cancer.  He is anxious about this however.  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  He does work on the treadmill and this does not problematic for him as his only form of exercise and only does this intermittently.  He has been seen by chiropractor and this did benefit him marginally with direct manipulation to the lumbar spine.  REVIEW OF SYSTEMS: No significant nighttime awakenings due to this issue he does every now and then wake up with anxiety. Denies fevers, chills, recent weight gain or weight loss.  No night sweats.  Pt denies any change in bowel or bladder habits, muscle weakness, numbness or falls associated with this pain.  HISTORY:  Prior history reviewed and updated per electronic medical record.  Patient Active Problem List   Diagnosis Date Noted  . Moderate recurrent major depression ([place]) 02/12/2017  . Chronic cough 06/29/2014  . Hyperlipidemia 03/14/2014    No rx primary prevention- calculate risk at 40    . OSA (obstructive sleep apnea) 11/08/2011    2013-  moderate, AHI 15/h, predom supine Has failed oral appliance therapy in the past CPAP auto avg 8   . MRSA infection 03/29/2011  . GAD (generalized anxiety disorder) 02/13/2011    zoloft 50mg . Since 2004-2005 has been through many meds including cymbalta, effexor, prozac- no improvement or SE. Counseling- was not very effective. Work stress and family drivers. Brother committed suicide 02/2014. Anxiety in family but not really talked about.    . Gastroesophageal reflux disease 02/13/2011    Zantac 150mg  once a day at night. Symptoms if takes after meal.    . Hypogonadism male 02/13/2011    Endocrinology management previously - weaned self off. Went on for fatigue- fatigue has not really worsened off.     Social History   Occupational History  . 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 Smoking status: Never Smoker  . Smokeless tobacco: Never Used  Substance and Sexual Activity  . Alcohol use: Yes    Alcohol/week: 0.0 standard drinks    Comment: 0-3 a week  . Drug use: No  . Sexual activity: Not on file   Social History   Social History Narrative   Married. 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 Generalized anxiety disorder   . GERD (gastroesophageal reflux disease)   . Hypogonadism in male    Managed by endo - Dr. Buddy Duty   Past Surgical History:  Procedure Laterality Date  . WISDOM TOOTH EXTRACTION     family history includes Anxiety disorder in his sister; Arthritis in his father; Cancer in his paternal grandmother; Dementia in his maternal grandfather; Emphysema in his  paternal grandfather; Healthy in his mother; Heart Problems in his paternal grandfather; Schizophrenia in his brother.  OBJECTIVE:  VS:  HT:6\' 1"  (185.4 cm)   WT:220 lb 3.2 oz (99.9 kg)  BMI:29.06    BP:106/78  HR:65bpm  TEMP: ( )  RESP:97 %   PHYSICAL EXAM: CONSTITUTIONAL: Well-developed, Well-nourished and In no acute distress EYES: Pupils are equal., EOM intact without nystagmus. and No scleral icterus. Psychiatric: Alert & appropriately interactive. and Not depressed or anxious appearing. EXTREMITY EXAM: Warm and well perfused  His back is overall well aligned.  He has a negative straight leg raise bilaterally.  He has normal lower extremity strength to manual muscle testing.  Normal lower extremity sensation.  He has a markedly tight hip flexor on the right compared to the left although both are functionally in a greater than 60 degree hip flexed position as a position of ease.  He has slight pain with Corky Sox testing.  Negative FADIR and negative logroll test.  Negative Stinchfield test.  No significant lower extremity swelling.   ASSESSMENT:   1. Right hip flexor tightness   2. Somatic dysfunction of lumbar region   3. Somatic dysfunction of pelvis region   4. Somatic dysfunction of sacral region   5. Anxiety     PROCEDURES:  PROCEDURE NOTE: OSTEOPATHIC MANIPULATION  The decision today to treat with Osteopathic Manipulative Therapy (OMT) was based on physical exam findings. Verbal consent was obtained following a discussion with the patient regarding the of risks, benefits and potential side effects, including an acute pain flare,post manipulation soreness and need for repeat treatments.   Contraindications to OMT: NONE Manipulation was performed as below: [facility] Tre[facility] & Osteopathic Exam Findings THORACIC SPINE:  T6 - 8 Neutral, rotated RIGHT, sidebent LEFT RIBS:  Rib 10 Right  Inhalation dysfunction (depressed/exhaled) LUMBAR SPINE:  L3 FRS RIGHT (Flexed, Rotated &  Sidebent) PELVIS:  Right psoas spasm Right anterior innonimate SACRUM:  L on L sacral torsion  OMT Techniques Used: HVLA muscle energy myofascial release  The patient tolerated the treatment well and reported Improved symptoms following treatment today. Patient was given medications, exercises, stretches and lifestyle modifications per AVS and verbally.  PROCEDURE NOTE: THERAPEUTIC EXERCISES [phone])  Discussed the foundation of treatment for this condition is physical therapy and/or daily (5-6 days/week) therapeutic exercises, focusing on core strengthening, coordination, neuromuscular control/reeducation. 15 minutes spent for Therapeutic exercises as below and as referenced in the AVS. This included exercises focusing on stretching, strengthening, with significant focus on eccentric aspects.  Proper technique shown and discussed handout in great detail with ATC. All questions were discussed and answered.  Long term goals include an improvement in range of motion, strength, endurance as well as avoiding reinjury. Frequency of visits is one time as determined during today's office visit. Frequency of exercises to be performed is as  per handout. EXERCISES REVIEWED: Archie Balboa Exercises Pelvic recruitment/tilting Hip flexor stretching     PLAN:  Pertinent additional documentation may be included in corresponding procedure notes, imaging studies, problem based documentation and patient instructions.  No problem-specific Assessment & Plan notes found for this encounter.   He has marked hip flexor contracture on the right worse than left and will benefit from OMT and home therapeutic exercises.  This does seem to be functional in nature.  Given the urinary changes I suspect that this is slightly an anxiety component but I did encourage him to follow-up with his PCP to have this further evaluated but I suspect it may be related more to pelvic floor dysfunction associated with the hip flexors.   He has no other red flag symptoms.  If any lack of improvement with either can consider x-rays of the lumbar spine that I suspect will show some mild degenerative changes.  He did respond well to direct manipulation to the hip flexors today including soft tissue manual release.  Links to [employer] provided today per Patient Instructions.  These exercises were developed by Minerva Ends, DC with a strong emphasis on core neuromuscular reducation and postural realignment through body-weight exercises.  Home Therapeutic exercises prescribed today per procedure note.  Discussed the underlying features of tight hip flexors leading to crouched, fetal like position that results in spinal column compression.  Including lumbar hyperflexion with hypermobility, thoracic flexion with restrictive rotation and cervical lordosis reversal.   Osteopathic manipulation was performed today based on physical exam findings.  Patient was counseled on the purpose and expected outcome of osteopathic manipulation and understands that a single treatment may not provide permanent long lasting relief.  They understand that home therapeutic exercises are critical part of the healing/treatment process and will continue with self treatment between now and their next visit as outlined.  The patient understands that the frequency of visits is meant to provide a stimulus to promote the body's own ability to heal and is not meant to be the sole means for improvement in their symptoms.  Activity modifications and the importance of avoiding exacerbating activities (limiting pain to no more than a 4 / 10 during or following activity) recommended and discussed.  Discussed red flag symptoms that warrant earlier emergent evaluation and patient voices understanding.   No orders of the defined types were placed in this encounter.  Lab Orders  No laboratory test(s) ordered today   Imaging Orders  No imaging studies  ordered today   Referral Orders  No referral(s) requested today    Return in about 3 weeks (around 06/25/2018).  Can consider repeat osteopathic manipulation and will need to obtain x-rays of his lumbar spine if any worsening symptoms         Gerda Diss, [facility] Physician

## 2018-06-04 NOTE — Patient Instructions (Signed)
Please perform the exercise program that we have prepared for you and gone over in detail on a daily basis.  In addition to the handout you were provided you can access your program through: [email or web address]   Your unique program code is:  [email or web address]   Also check out [employer]" which is a program developed by Dr. Minerva Ends.   There are links to a couple of his YouTube Videos below and I would like to see you performing one of his videos 5-6 days per week.  It is best to do these exercises first thing in the morning.  They will give you a good jumpstart here today and start normalizing the way you move.  A good intro video is: "Independence from Pain 7-minute Video" - [email or web address]   A more advanced video is: Interior and spatial designer original 12 minutes" - [email or web address].king-[email or web address]  Exercises that focus more on the neck are as below: Dr. Archie Balboa with [facility] teaching neck and shoulder details Part 1 - https://youtu.be/[email or web address] Part 2 Dr. Archie Balboa with [facility] quick routine to practice daily - https://youtu.be/[email or web address]  Do not try to attempt the entire video when first beginning.  Try breaking of each exercise that he goes into shorter segments.  In other words, if they perform an exercise for 45 seconds, start with 15 seconds and rest and then resume when they begin the new activity.  If you work your way up to being able to do these videos without having to stop, I expect you will see significant improvements in your pain.  If you enjoy his videos and would like to find out more you can look on his website: [email or web address]  He has a workout streaming option as well as a DVD set available for purchase.  Amazon has the best price for his DVDs.

## 2018-06-12 ENCOUNTER — Other Ambulatory Visit: Payer: Self-pay | Admitting: Family Medicine

## 2018-06-12 NOTE — Telephone Encounter (Signed)
Okay for 90 day refill instead of 30 day?   Please advise

## 2018-06-15 NOTE — Telephone Encounter (Signed)
im ok switching to 90 days- but we need to get him in for a follow up visit. He hasnt been seen since 2018

## 2018-06-15 NOTE — Telephone Encounter (Signed)
Tried to call pt to schedule appt. No answer. Will try again later

## 2018-06-17 NOTE — Telephone Encounter (Signed)
Spoke to pt and he is coming in for [place] 3/9 at 4:20pm  Marcene Brawn can you please schedule pt for me?  Thanks in advance

## 2018-06-17 NOTE — Telephone Encounter (Signed)
Patient is scheduled   

## 2018-06-17 NOTE — Telephone Encounter (Signed)
Noted  

## 2018-06-22 ENCOUNTER — Encounter: Payer: Self-pay | Admitting: Family Medicine

## 2018-06-22 ENCOUNTER — Ambulatory Visit (INDEPENDENT_AMBULATORY_CARE_PROVIDER_SITE_OTHER): Payer: BLUE CROSS/BLUE SHIELD | Admitting: Family Medicine

## 2018-06-22 VITALS — BP 100/70 | HR 68 | Temp 98.6°F | Ht 73.0 in | Wt 219.2 lb

## 2018-06-22 DIAGNOSIS — E785 Hyperlipidemia, unspecified: Secondary | ICD-10-CM | POA: Diagnosis not present

## 2018-06-22 DIAGNOSIS — Z0000 Encounter for general adult medical examination without abnormal findings: Secondary | ICD-10-CM

## 2018-06-22 DIAGNOSIS — K625 Hemorrhage of anus and rectum: Secondary | ICD-10-CM | POA: Diagnosis not present

## 2018-06-22 DIAGNOSIS — Z125 Encounter for screening for malignant neoplasm of prostate: Secondary | ICD-10-CM | POA: Diagnosis not present

## 2018-06-22 DIAGNOSIS — F331 Major depressive disorder, recurrent, moderate: Secondary | ICD-10-CM

## 2018-06-22 DIAGNOSIS — R3912 Poor urinary stream: Secondary | ICD-10-CM

## 2018-06-22 DIAGNOSIS — F411 Generalized anxiety disorder: Secondary | ICD-10-CM

## 2018-06-22 MED ORDER — ESCITALOPRAM OXALATE 20 MG PO TABS: 20.0000 mg | ORAL_TABLET | Freq: Every day | ORAL | 3 refills | Status: DC

## 2018-06-22 NOTE — Patient Instructions (Addendum)
Schedule a lab visit at the check out desk within 2 weeks. Return for future fasting labs meaning nothing but water after midnight please. Ok to take your medications with water.   Usually physicals have no copay- since we changed to physical today- please check with desk- they may be able to refund your copay  Lets set a goal of 5-10 lbs off by next years physical. Can go ahead and schedule physical for next year as well

## 2018-06-22 NOTE — Progress Notes (Signed)
Phone: [phone]    Subjective:  Patient presents today for their annual physical. Chief complaint-noted.   See problem oriented charting- ROS- full  review of systems was completed and negative except for: low back pain and hips improving with exercise.  Has had weak urinary stream and some dribbling at times.  The following were reviewed and entered/updated in epic: Past Medical History:  Diagnosis Date  . Generalized anxiety disorder   . GERD (gastroesophageal reflux disease)   . Hypogonadism in male    in past - Managed by endo - Dr. Buddy Duty. self weaned from meds and no signifcant change in symptoms.    Patient Active Problem List   Diagnosis Date Noted  . Moderate recurrent major depression ([place]) 02/12/2017    Priority: High  . Hyperlipidemia 03/14/2014    Priority: Medium  . OSA (obstructive sleep apnea) 11/08/2011    Priority: Medium  . GAD (generalized anxiety disorder) 02/13/2011    Priority: Medium  . Gastroesophageal reflux disease 02/13/2011    Priority: Medium  . Hypogonadism male 02/13/2011    Priority: Medium  . Chronic cough 06/29/2014    Priority: Low  . MRSA infection 03/29/2011    Priority: Low   Past Surgical History:  Procedure Laterality Date  . WISDOM TOOTH EXTRACTION      Family History  Problem Relation Age of Onset  . Schizophrenia Brother        died 42 - suicide. diagnosis was unclear- brother would not get help  . Dementia Maternal Grandfather   . Healthy Mother   . Arthritis Father   . Anxiety disorder Sister   . Cancer Paternal Grandmother        unknown= in bones  . Emphysema Paternal Grandfather   . Heart Problems Paternal Grandfather        pacemaker    Medications- reviewed and updated Current Outpatient Medications  Medication Sig Dispense Refill  . escitalopram (LEXAPRO) 20 MG tablet Take 1 tablet (20 mg total) by mouth daily. 90 tablet 3   No current facility-administered medications for this visit.      Allergies-reviewed and updated No Known Allergies  Social History   Social History Narrative   Married. 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 Oropharynx normal Neck: no thyromegaly CV: RRR no murmurs rubs or gallops Lungs: CTAB no crackles, wheeze, rhonchi Abdomen: soft/nontender/nondistended/normal bowel sounds. No rebound or guarding.  Ext: no edema Skin: warm, dry Neuro: grossly normal, moves all extremities, PERRLA Rectal: normal tone, diffusely enlarged prostate, no masses or tenderness     Assessment and Plan:  43 y.o. male presenting for annual physical.  Health Maintenance counseling: 1. Anticipatory guidance: Patient counseled regarding regular dental exams -q6 months, eye exams - no issues with vision,  avoiding  smoking and second hand smoke , limiting alcohol to 2 beverages per day- 2 a week.   2. Risk factor reduction:  Advised patient of need for regular exercise and diet rich and fruits and vegetables to reduce risk of heart attack and stroke. Exercise- trying to walk 3x a week. Diet-feels like tries to eat healthy but with some spluges. Discussed trying to get down perhaps 5-10  Wt Readings from Last 3 Encounters:  06/22/18 219 lb 3.2 oz (99.4 kg)  06/04/18 220 lb 3.2 oz (99.9 kg)  04/09/18 223 lb 4 oz (101.3 kg)  3. Immunizations/screenings/ancillary studies- declined flu shot Immunization  History  Administered Date(s) Administered  . Tdap 10/31/2014   Health Maintenance Due  Topic Date Due  . HIV Screening - donated blood [facility] fr[facility] 01/04/1992   4. Prostate cancer screening- will get UA-  has noted some weak urine stream. Some dribbling at the end. Requests PSA and rectal- BPH on exam could be cause of symptoms. No family history.   5. Colon cancer screening - no family history, start at age 40. Noted a bulge and had some rectal bleeding- likely hemorrhoid.  Will get stool cards to be on the safe side. 6. Skin cancer screening/prevention- no recent visit- goes every 3-5 years. advised regular sunscreen use. Denies worrisome, changing, or new skin lesions.  7. Testicular cancer screening- advised monthly self exams  8. STD screening- patient opts out - monogomous 9. Never smoker-   Status of chronic or acute concerns   Moderate recurrent major depression (HCC) GAD  s: Depression remains controlled with PHQ 9 of 4 today.  He states anxiety is reasonably controlled as well.  He is doing reasonably well on the Lexapro 20 mg. A/P: Stable. Continue current medications.    Mild hyperlipidemia in the past- update lipids and ten-year risk  Future Appointments  Date Time Provider [place]  07/02/2019  8:20 AM Marin Olp, MD [facility]   Return in about 1 year (around 06/22/2019) for physical.  Lab/Order associations: will come back fasting  Preventative health care - Plan: PSA, CBC, Comprehensive metabolic panel, Lipid panel, Urinalysis, Fecal occult blood, imunochemical  Rectal bleeding - Plan: Fecal occult blood, imunochemical  Hyperlipidemia, unspecified hyperlipidemia type - Plan: CBC, Comprehensive metabolic panel, Lipid panel  Screening for prostate cancer - Plan: PSA  Weak urinary stream - Plan: PSA, Urinalysis  Moderate recurrent major depression (HCC)  GAD (generalized anxiety disorder)  Meds ordered this encounter  Medications  .  escitalopram (LEXAPRO) 20 MG tablet    Sig: Take 1 tablet (20 mg total) by mouth daily.    Dispense:  90 tablet    Refill:  3    Return precautions advised.   Garret Reddish, MD

## 2018-06-22 NOTE — Assessment & Plan Note (Signed)
GAD  s: Depression remains controlled with PHQ 9 of 4 today.  He states anxiety is reasonably controlled as well.  He is doing reasonably well on the Lexapro 20 mg. A/P: Stable. Continue current medications.

## 2018-06-25 ENCOUNTER — Other Ambulatory Visit: Payer: Self-pay

## 2018-06-25 ENCOUNTER — Other Ambulatory Visit (INDEPENDENT_AMBULATORY_CARE_PROVIDER_SITE_OTHER): Payer: BLUE CROSS/BLUE SHIELD

## 2018-06-25 DIAGNOSIS — Z125 Encounter for screening for malignant neoplasm of prostate: Secondary | ICD-10-CM

## 2018-06-25 DIAGNOSIS — K625 Hemorrhage of anus and rectum: Secondary | ICD-10-CM

## 2018-06-25 DIAGNOSIS — R3912 Poor urinary stream: Secondary | ICD-10-CM

## 2018-06-25 DIAGNOSIS — Z0000 Encounter for general adult medical examination without abnormal findings: Secondary | ICD-10-CM | POA: Diagnosis not present

## 2018-06-25 DIAGNOSIS — E785 Hyperlipidemia, unspecified: Secondary | ICD-10-CM

## 2018-06-25 LAB — URINALYSIS
Bilirubin Urine: NEGATIVE
Hgb urine dipstick: NEGATIVE
Ketones, ur: NEGATIVE
Leukocytes,Ua: NEGATIVE
Nitrite: NEGATIVE
Specific Gravity, Urine: 1.025 (ref 1.000–1.030)
Total Protein, Urine: NEGATIVE
Urine Glucose: NEGATIVE
Urobilinogen, UA: 0.2 (ref 0.0–1.0)
pH: 6 (ref 5.0–8.0)

## 2018-06-25 LAB — COMPREHENSIVE METABOLIC PANEL
ALT: 33 U/L (ref 0–53)
AST: 19 U/L (ref 0–37)
Albumin: 4.6 g/dL (ref 3.5–5.2)
Alkaline Phosphatase: 53 U/L (ref 39–117)
BUN: 14 mg/dL (ref 6–23)
CO2: 27 mEq/L (ref 19–32)
Calcium: 9.4 mg/dL (ref 8.4–10.5)
Chloride: 103 mEq/L (ref 96–112)
Creatinine, Ser: 1 mg/dL (ref 0.40–1.50)
GFR: 82.15 mL/min (ref >60.00)
Glucose, Bld: 93 mg/dL (ref 70–99)
Potassium: 4.3 mEq/L (ref 3.5–5.1)
Sodium: 138 mEq/L (ref 135–145)
Total Bilirubin: 0.5 mg/dL (ref 0.2–1.2)
Total Protein: 6.8 g/dL (ref 6.0–8.3)

## 2018-06-25 LAB — CBC
HCT: 39.3 % (ref 39.0–52.0)
Hemoglobin: 13.4 g/dL (ref 13.0–17.0)
MCHC: 34.1 g/dL (ref 30.0–36.0)
MCV: 87.4 fl (ref 78.0–100.0)
Platelets: 215 10*3/uL (ref 150.0–400.0)
RBC: 4.5 Mil/uL (ref 4.22–5.81)
RDW: 12.9 % (ref 11.5–15.5)
WBC: 6.2 10*3/uL (ref 4.0–10.5)

## 2018-06-25 LAB — LIPID PANEL
Cholesterol: 218 mg/dL — ABNORMAL HIGH (ref 0–200)
HDL: 37 mg/dL — ABNORMAL LOW (ref >39.00)
LDL Cholesterol: 147 mg/dL — ABNORMAL HIGH (ref 0–99)
NonHDL: 180.89
Total CHOL/HDL Ratio: 6
Triglycerides: 168 mg/dL — ABNORMAL HIGH (ref 0.0–149.0)
VLDL: 33.6 mg/dL (ref 0.0–40.0)

## 2018-06-25 LAB — PSA: PSA: 0.21 ng/mL (ref 0.10–4.00)

## 2018-06-26 ENCOUNTER — Ambulatory Visit: Payer: BLUE CROSS/BLUE SHIELD | Admitting: Sports Medicine

## 2018-07-07 ENCOUNTER — Other Ambulatory Visit: Payer: BLUE CROSS/BLUE SHIELD

## 2018-07-07 LAB — FECAL OCCULT BLOOD, IMMUNOCHEMICAL: Fecal Occult Bld: NEGATIVE

## 2019-02-23 DIAGNOSIS — M9902 Segmental and somatic dysfunction of thoracic region: Secondary | ICD-10-CM | POA: Diagnosis not present

## 2019-02-23 DIAGNOSIS — M542 Cervicalgia: Secondary | ICD-10-CM | POA: Diagnosis not present

## 2019-02-23 DIAGNOSIS — M9901 Segmental and somatic dysfunction of cervical region: Secondary | ICD-10-CM | POA: Diagnosis not present

## 2019-02-23 DIAGNOSIS — M546 Pain in thoracic spine: Secondary | ICD-10-CM | POA: Diagnosis not present

## 2019-06-16 ENCOUNTER — Other Ambulatory Visit: Payer: Self-pay

## 2019-06-16 NOTE — Progress Notes (Signed)
Phone [phone] In person visit   Subjective:   Thomas Duran is a 44 y.o. year old very pleasant male patient who presents for/with See problem oriented charting Chief Complaint  Patient presents with  . discomfort with swallowing   This visit occurred during the SARS-CoV-2 public health emergency.  Safety protocols were in place, including screening questions prior to the visit, additional usage of staff PPE, and extensive cleaning of exam room while observing appropriate contact time as indicated for disinfecting solutions.   Past Medical History-  Patient Active Problem List   Diagnosis Date Noted  . Major depression in full remission ([place]) 02/12/2017    Priority: High  . Hyperlipidemia 03/14/2014    Priority: Medium  . OSA (obstructive sleep apnea) 11/08/2011    Priority: Medium  . GAD (generalized anxiety disorder) 02/13/2011    Priority: Medium  . Gastroesophageal reflux disease 02/13/2011    Priority: Medium  . Hypogonadism male 02/13/2011    Priority: Medium  . Chronic cough 06/29/2014    Priority: Low  . MRSA infection 03/29/2011    Priority: Low    Medications- reviewed and updated Current Outpatient Medications  Medication Sig Dispense Refill  . escitalopram (LEXAPRO) 20 MG tablet Take 1 tablet (20 mg total) by mouth daily. 90 tablet 3  . omeprazole (PRILOSEC) 40 MG capsule Take 1 capsule (40 mg total) by mouth daily. 30 capsule 1   No current facility-administered medications for this visit.     Objective:  BP 112/70   Pulse 76   Temp 98 F (36.7 C) (Temporal)   Ht 6\' 1"  (1.854 m)   Wt 221 lb (100.2 kg)   SpO2 95%   BMI 29.16 kg/m  Gen: NAD, resting comfortably CV: RRR no murmurs rubs or gallops Lungs: CTAB no crackles, wheeze, rhonchi Abdomen: soft/nontender= other than mild discomfort in upper abdomen with dep palpatoin/nondistended/normal bowel sounds. No rebound or guarding.  Ext: no edema Skin: warm, dry     Assessment and Plan    # discomfort with swallowing/shortness of breath S:Patient describes as "tighness" near sternum when swallowin- bothers him with swallowing liquids, solids, even saliva. Zantac in past but with recall came off on that. Takes Tums several times a day and helps short term. Feels like food or liquid are going down and feels like it hits a "speed bump". Tums helps reflux but doesn't diminish this symptom. Current symptoms going on for several weeks but not getting worse- rather persistent discomfort.  He has not noticed any change in ability to eat or drink. Feels some mild heaviness with deep breaths. Symptoms are worse with stress. Has gained a few lbs and has been trying to walk 3-4x a week- does note some mild windedness with exertion. No exertional component to the discomfort in sternum.  - does have some mild cough in AM when wakes up- has some clear mucus but resolves after that (asked patient due to chronic cough listed from 2016 or so on problem list) A/P: 44 year old male with mild shortness of breath and some trouble swallowing. We opted to get a chest x-ray today. He does not ave chest pain with exertion and we did not think this was likely coming form his heart. He does have a history of GERD so we opted to try one month of stronger prilosec (after a month he can use 20mg  over the counter). He could have some narrowing of lower portion of esophagus and we went ahead and referred  to [facility] in case he is not improving rapidly with medicine. If he has new or worsening symptoms he will let us know ASAP.      # Depression/GAD S: reasonable control of depresison and anxiety. Still occasionally gets some anxiety but not life limiting Depression screen [facility] 2/9 06/17/2019  Decreased Interest 0  Down, Depressed, Hopeless 0  PHQ - 2 Score 0  Altered sleeping 2  Tired, decreased energy 1  Change in appetite 0  Feeling bad or failure about yourself  0  Trouble concentrating 0  Moving slowly or  fidgety/restless 0  Suicidal thoughts 0  PHQ-9 Score 3  Difficult doing work/chores Not difficult at all  A/P: reasonable control- continue lexapro 20 mg. If issues get more stable from first problem (dysphagia) we discussed may even try 10mg  dose- he may cut in half and update me by mychart if he decides to do so  # mild high cholesterol but not at point he needs meds- we did decide to go ahead and update labs since hes here ahead of physical next month   Recommended follow up: physical next month  Future Appointments  Date Time Provider [place]  07/21/2019  8:20 AM Marin Olp, MD [facility]    Lab/Order associations: not fasting   ICD-10-CM   1. Shortness of breath  R06.02 DG Chest 2 View  2. Dysphagia, unspecified type  R13.10 Ambulatory referral to Gastroenterology  3. Gastroesophageal reflux disease without esophagitis  K21.9   4. Recurrent major depressive disorder, in full remission ([place])  F33.42   5. GAD (generalized anxiety disorder)  F41.1   6. Hyperlipidemia, unspecified hyperlipidemia type  E78.5 CBC with Differential/Platelet    Comprehensive metabolic panel    Lipid panel    Meds ordered this encounter  Medications  . omeprazole (PRILOSEC) 40 MG capsule    Sig: Take 1 capsule (40 mg total) by mouth daily.    Dispense:  30 capsule    Refill:  1  . escitalopram (LEXAPRO) 20 MG tablet    Sig: Take 1 tablet (20 mg total) by mouth daily.    Dispense:  90 tablet    Refill:  3   Return precautions advised.  Garret Reddish, MD

## 2019-06-17 ENCOUNTER — Ambulatory Visit (INDEPENDENT_AMBULATORY_CARE_PROVIDER_SITE_OTHER): Payer: BC Managed Care – PPO | Admitting: Family Medicine

## 2019-06-17 ENCOUNTER — Ambulatory Visit (INDEPENDENT_AMBULATORY_CARE_PROVIDER_SITE_OTHER): Payer: Self-pay

## 2019-06-17 ENCOUNTER — Encounter: Payer: Self-pay | Admitting: Family Medicine

## 2019-06-17 VITALS — BP 112/70 | HR 76 | Temp 98.0°F | Ht 73.0 in | Wt 221.0 lb

## 2019-06-17 DIAGNOSIS — K219 Gastro-esophageal reflux disease without esophagitis: Secondary | ICD-10-CM | POA: Diagnosis not present

## 2019-06-17 DIAGNOSIS — R0602 Shortness of breath: Secondary | ICD-10-CM | POA: Diagnosis not present

## 2019-06-17 DIAGNOSIS — F411 Generalized anxiety disorder: Secondary | ICD-10-CM

## 2019-06-17 DIAGNOSIS — R1310 Dysphagia, unspecified: Secondary | ICD-10-CM | POA: Diagnosis not present

## 2019-06-17 DIAGNOSIS — E785 Hyperlipidemia, unspecified: Secondary | ICD-10-CM | POA: Diagnosis not present

## 2019-06-17 DIAGNOSIS — F3342 Major depressive disorder, recurrent, in full remission: Secondary | ICD-10-CM

## 2019-06-17 LAB — CBC WITH DIFFERENTIAL/PLATELET
Basophils Absolute: 0 10*3/uL (ref 0.0–0.1)
Basophils Relative: 0.6 % (ref 0.0–3.0)
Eosinophils Absolute: 0.3 10*3/uL (ref 0.0–0.7)
Eosinophils Relative: 4.2 % (ref 0.0–5.0)
HCT: 42 % (ref 39.0–52.0)
Hemoglobin: 14.5 g/dL (ref 13.0–17.0)
Lymphocytes Relative: 31.5 % (ref 12.0–46.0)
Lymphs Abs: 2.1 10*3/uL (ref 0.7–4.0)
MCHC: 34.5 g/dL (ref 30.0–36.0)
MCV: 88 fl (ref 78.0–100.0)
Monocytes Absolute: 0.6 10*3/uL (ref 0.1–1.0)
Monocytes Relative: 9.4 % (ref 3.0–12.0)
Neutro Abs: 3.6 10*3/uL (ref 1.4–7.7)
Neutrophils Relative %: 54.3 % (ref 43.0–77.0)
Platelets: 209 10*3/uL (ref 150.0–400.0)
RBC: 4.77 Mil/uL (ref 4.22–5.81)
RDW: 13.3 % (ref 11.5–15.5)
WBC: 6.6 10*3/uL (ref 4.0–10.5)

## 2019-06-17 LAB — COMPREHENSIVE METABOLIC PANEL
ALT: 26 U/L (ref 0–53)
AST: 17 U/L (ref 0–37)
Albumin: 4.4 g/dL (ref 3.5–5.2)
Alkaline Phosphatase: 43 U/L (ref 39–117)
BUN: 16 mg/dL (ref 6–23)
CO2: 30 mEq/L (ref 19–32)
Calcium: 9.8 mg/dL (ref 8.4–10.5)
Chloride: 103 mEq/L (ref 96–112)
Creatinine, Ser: 1.01 mg/dL (ref 0.40–1.50)
GFR: 80.83 mL/min (ref >60.00)
Glucose, Bld: 93 mg/dL (ref 70–99)
Potassium: 4.1 mEq/L (ref 3.5–5.1)
Sodium: 139 mEq/L (ref 135–145)
Total Bilirubin: 0.6 mg/dL (ref 0.2–1.2)
Total Protein: 7.1 g/dL (ref 6.0–8.3)

## 2019-06-17 LAB — LIPID PANEL
Cholesterol: 238 mg/dL — ABNORMAL HIGH (ref 0–200)
HDL: 36.3 mg/dL — ABNORMAL LOW (ref >39.00)
LDL Cholesterol: 163 mg/dL — ABNORMAL HIGH (ref 0–99)
NonHDL: 201.58
Total CHOL/HDL Ratio: 7
Triglycerides: 193 mg/dL — ABNORMAL HIGH (ref 0.0–149.0)
VLDL: 38.6 mg/dL (ref 0.0–40.0)

## 2019-06-17 MED ORDER — ESCITALOPRAM OXALATE 20 MG PO TABS: 20.0000 mg | ORAL_TABLET | Freq: Every day | ORAL | 3 refills | Status: DC

## 2019-06-17 MED ORDER — OMEPRAZOLE 40 MG PO CPDR: 40.0000 mg | DELAYED_RELEASE_CAPSULE | Freq: Every day | ORAL | 1 refills | Status: DC

## 2019-06-17 NOTE — Progress Notes (Signed)
Chest x-ray without signs of pneumonia or any obvious mass or cause of your discomfort

## 2019-06-17 NOTE — Patient Instructions (Addendum)
44 year old male with mild shortness of breath and some trouble swallowing. We opted to get a chest x-ray today. He does not ave chest pain with exertion and we did not think this was likely coming form his heart. He does have a history of GERD so we opted to try one month of stronger prilosec (after a month he can use 20mg  over the counter). He could have some narrowing of lower portion of esophagus and we went ahead and referred to [facility] docs in case he is not improving rapidly with medicine. If he has new or worsening symptoms he will let us know ASAP.    Please stop by lab and x-ray before you go If you do not have mychart- we will call you about results within 5 business days of [place] receiving them.  If you have mychart- we will send your results within 3 business days of [place] receiving them.  If abnormal or we want to clarify a result, we will call or mychart you to make sure you receive the message.  If you have questions or concerns or don't hear within 5 business days, please send [place] a message or call us.   Recommended follow up: see you for physical next month

## 2019-07-02 ENCOUNTER — Encounter: Payer: BLUE CROSS/BLUE SHIELD | Admitting: Family Medicine

## 2019-07-09 ENCOUNTER — Other Ambulatory Visit: Payer: Self-pay | Admitting: Family Medicine

## 2019-07-21 ENCOUNTER — Ambulatory Visit (INDEPENDENT_AMBULATORY_CARE_PROVIDER_SITE_OTHER): Payer: BC Managed Care – PPO | Admitting: Family Medicine

## 2019-07-21 ENCOUNTER — Other Ambulatory Visit: Payer: Self-pay

## 2019-07-21 ENCOUNTER — Encounter: Payer: Self-pay | Admitting: Family Medicine

## 2019-07-21 VITALS — BP 120/74 | HR 69 | Temp 98.1°F | Ht 73.0 in | Wt 226.0 lb

## 2019-07-21 DIAGNOSIS — E785 Hyperlipidemia, unspecified: Secondary | ICD-10-CM | POA: Diagnosis not present

## 2019-07-21 DIAGNOSIS — K219 Gastro-esophageal reflux disease without esophagitis: Secondary | ICD-10-CM

## 2019-07-21 DIAGNOSIS — F3342 Major depressive disorder, recurrent, in full remission: Secondary | ICD-10-CM | POA: Diagnosis not present

## 2019-07-21 DIAGNOSIS — G4733 Obstructive sleep apnea (adult) (pediatric): Secondary | ICD-10-CM | POA: Diagnosis not present

## 2019-07-21 DIAGNOSIS — Z0000 Encounter for general adult medical examination without abnormal findings: Secondary | ICD-10-CM | POA: Diagnosis not present

## 2019-07-21 DIAGNOSIS — F411 Generalized anxiety disorder: Secondary | ICD-10-CM

## 2019-07-21 MED ORDER — OMEPRAZOLE 40 MG PO CPDR: DELAYED_RELEASE_CAPSULE | ORAL | 1 refills | Status: DC

## 2019-07-21 NOTE — Progress Notes (Signed)
Phone: [phone]    Subjective:  Patient presents today for their annual physical. Chief complaint-noted.   See problem oriented charting- Review of Systems  Constitutional: Negative for chills and fever.  HENT: Negative for ear pain, hearing loss and tinnitus.   Eyes: Negative for blurred vision, double vision and photophobia.  Respiratory: Negative for cough, shortness of breath and wheezing.   Cardiovascular: Negative for chest pain, palpitations and leg swelling.  Gastrointestinal: Positive for heartburn. Negative for constipation, diarrhea, nausea and vomiting.       In past treated with medications   Genitourinary: Negative for dysuria, frequency and urgency.  Musculoskeletal: Negative for back pain, joint pain and neck pain.  Skin: Negative for itching and rash.  Neurological: Negative for dizziness, tingling, weakness and headaches.  Endo/Heme/Allergies: Does not bruise/bleed easily.  Psychiatric/Behavioral: Negative for depression, memory loss and suicidal ideas. The patient is not nervous/anxious and does not have insomnia.     The following were reviewed and entered/updated in epic: Past Medical History:  Diagnosis Date  . Generalized anxiety disorder   . GERD (gastroesophageal reflux disease)   . Hypogonadism in male    in past - Managed by endo - Dr. Buddy Duty. self weaned from meds and no signifcant change in symptoms.    Patient Active Problem List   Diagnosis Date Noted  . Major depression in full remission ([place]) 02/12/2017    Priority: High  . Hyperlipidemia 03/14/2014    Priority: Medium  . OSA (obstructive sleep apnea) 11/08/2011    Priority: Medium  . GAD (generalized anxiety disorder) 02/13/2011    Priority: Medium  . Gastroesophageal reflux disease 02/13/2011    Priority: Medium  . Hypogonadism male 02/13/2011    Priority: Medium  . Chronic cough 06/29/2014    Priority: Low  . MRSA infection 03/29/2011    Priority: Low   Past Surgical History:    Procedure Laterality Date  . WISDOM TOOTH EXTRACTION      Family History  Problem Relation Age of Onset  . Schizophrenia Brother        died 60 - suicide. diagnosis was unclear- brother would not get help  . Dementia Maternal Grandfather   . Healthy Mother   . Arthritis Father   . Anxiety disorder Sister   . Cancer Paternal Grandmother        unknown= in bones  . Emphysema Paternal Grandfather   . Heart Problems Paternal Grandfather        pacemaker    Medications- reviewed and updated Current Outpatient Medications  Medication Sig Dispense Refill  . escitalopram (LEXAPRO) 20 MG tablet Take 1 tablet (20 mg total) by mouth daily. 90 tablet 3  . omeprazole (PRILOSEC) 40 MG capsule TAKE 1 CAPSULE BY MOUTH EVERY DAY 30 capsule 1   No current facility-administered medications for this visit.    Allergies-reviewed and updated No Known Allergies  Social History   Social History Narrative   Married. 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 Oropharynx normal Neck: no thyromegaly CV: RRR no murmurs rubs or gallops Lungs: CTAB no crackles, wheeze, rhonchi Abdomen: soft/nontender/nondistended/normal bowel sounds. No rebound or guarding.  Ext: no edema Skin: warm, dry Neuro: grossly normal, moves all extremities, PERRLA    Assessment and Plan:  44 y.o. male presenting for annual physical.  Health Maintenance counseling: 1. Anticipatory guidance: Patient counseled regarding  regular dental exams q6 months, eye exams never had any issues,  avoiding smoking and second hand smoke , limiting alcohol to 2 beverages per day. Occasionally 1-2 beers a week.    2. Risk factor reduction:  Advised patient of need for regular exercise and diet rich and fruits and vegetables to reduce risk of heart attack and stroke. Exercise- twice a week for 1 hr with personal trainer coming to the house. [facility] doctor. Diet-Maintains healthy diet. Weight up slightly but he feels like he has actually lost some from waist perspective with personal trainer Wt Readings from Last 3 Encounters:  07/21/19 226 lb (102.5 kg)  06/17/19 221 lb (100.2 kg)  06/22/18 219 lb 3.2 oz (99.4 kg)  3. Immunizations/screenings/ancillary studies-discussed COVID-19 vaccination and he is scheduled despite some reservations  Immunization History  Administered Date(s) Administered  . Tdap 10/31/2014  4. Prostate cancer screening- some BPH on exam last year.  Did early PSA due to weak stream/dribbling at end of stream- that has improved.  Urinalysis was reassuring at that time  Lab Results  Component Value Date   PSA 0.21 06/25/2018   5. Colon cancer screening -  no family history, start at age 80. Prior hemorrhoid bleeding-did stool cards last year and negative for blood 6. Skin cancer screening/prevention- Has seen Dermatology in the past- probably over years. advised regular sunscreen use. Denies worrisome, changing, or new skin lesions.   7. Testicular cancer screening- advised monthly self exams. Has not been doing normally but we recommended  8. STD screening- patient opts out-married does not feel needed.  9. never smoker   Status of chronic or acute concerns   Gastroesophageal reflux disease  -Saw patient approximately a month ago and was complaining of mild shortness of breath as well as some difficulty swallowing.  Chest x-ray was reassuring.  No chest pain with exertion. -We did increase  omeprazole to 40 mg with plan to do this for at least a month to see if it improved his symptoms.  We also referred to GI for possible esophageal stenosis-does not appear that GI referral has been fully processed-patient has not had a visit yet. - patient has had significant improvement in reflux and denies any significant dysphagia- states may feel slight twinge in epigastric area when swallows but not major concern- doesn't notice it unless really thinking aout it - we are going to try 40mg  omeprazole for total of 3 months- he will update me in 2 more months and if continues to do well can try the 20mg  over the counter dose. If symptoms worsen, he will also let me know and we will resubmit GI referral- we are going to hold off for now given significant improvement - also with weight loss plans (20 lbs) I suspect we can get him back down to at least pepcid within 6-12 months if not off  Recurrent major depressive disorder, in full remission (HCC)/GAD -Depression appears in full remission and generalized  anxiety is reasonably controlled. -Last visit with how well he was doing we considered reducing to 10 mg-he is going to cut the 20mg  in half at this point as he feels things are pretty stable on home/job front- he will update me in 2 months about how he is feeling- we can always easily go back up to 10 mg    OSA (obstructive sleep apnea) -Patient compliant with CPAP  hyperlipidemia -Patient not on statin.  10-year ASCVD risk only 2.7%.  Continue to work on healthy eating/regular exercise and repeat next year. Has been working with Physiological scientist and working on diet to improve. Stay off meds for now Lab Results  Component Value Date   CHOL 238 (H) 06/17/2019   HDL 36.30 (L) 06/17/2019   LDLCALC 163 (H) 06/17/2019   TRIG 193.0 (H) 06/17/2019   CHOLHDL 7 06/17/2019   Recommended follow up: Return in about 1 year (around 07/20/2020) for physical or sooner if needed.  Lab/Order associations:  fasting labs done last month.    ICD-10-CM   1. Preventative health care  Z00.00 CBC with Differential/Platelet    Comprehensive metabolic panel    Lipid panel  2. Recurrent major depressive disorder, in full remission ([place])  F33.42   3. OSA (obstructive sleep apnea)  G47.33   4. Hyperlipidemia, unspecified hyperlipidemia type  E78.5 CBC with Differential/Platelet    Comprehensive metabolic panel    Lipid panel  5. Gastroesophageal reflux disease without esophagitis  K21.9   6. GAD (generalized anxiety disorder)  F41.1     Meds ordered this encounter  Medications  . omeprazole (PRILOSEC) 40 MG capsule    Sig: TAKE 1 CAPSULE BY MOUTH EVERY DAY    Dispense:  30 capsule    Refill:  1    Return precautions advised.   Garret Reddish, MD

## 2019-07-21 NOTE — Addendum Note (Signed)
Addended by: Francella Solian on: 07/21/2019 09:17 AM   Modules accepted: Orders

## 2019-07-21 NOTE — Patient Instructions (Addendum)
-   we are going to try 40mg  omeprazole for total of 3 months- he will update me in 2 more months and if continues to do well can try the 20mg  over the counter dose. If symptoms worsen, he will also let me know and we will resubmit GI referral- we are going to hold off for now given significant improvement  -he is going to cut the 20mg  in half at this point as he feels things are pretty stable on home/job front- he will update me in 2 months about how he is feeling- we can always easily go back up to 10 mg    Recommended follow up: Return in about 1 year (around 07/20/2020) for physical or sooner if needed.

## 2019-07-21 NOTE — Assessment & Plan Note (Signed)
-  Saw patient approximately a month ago and was complaining of mild shortness of breath as well as some difficulty swallowing.  Chest x-ray was reassuring.  No chest pain with exertion. -We did increase omeprazole to 40 mg with plan to do this for at least a month to see if it improved his symptoms.  We also referred to GI for possible esophageal stenosis-does not appear that GI referral has been fully processed-patient has not had a visit yet. - patient has had significant improvement in reflux and denies any significant dysphagia- states may feel slight twinge in epigastric area when swallows but not major concern- doesn't notice it unless really thinking aout it - we are going to try 40mg  omeprazole for total of 3 months- he will update me in 2 more months and if continues to do well can try the 20mg  over the counter dose. If symptoms worsen, he will also let me know and we will resubmit GI referral- we are going to hold off for now given significant improvement - also with weight loss plans (20 lbs) I suspect we can get him back down to at least pepcid within 6-12 months if not off

## 2019-08-03 ENCOUNTER — Other Ambulatory Visit: Payer: Self-pay | Admitting: Family Medicine

## 2019-09-06 ENCOUNTER — Other Ambulatory Visit: Payer: Self-pay | Admitting: Family Medicine

## 2019-09-17 ENCOUNTER — Ambulatory Visit: Payer: BC Managed Care – PPO | Admitting: Family Medicine

## 2020-01-10 DIAGNOSIS — M542 Cervicalgia: Secondary | ICD-10-CM | POA: Diagnosis not present

## 2020-01-10 DIAGNOSIS — M9901 Segmental and somatic dysfunction of cervical region: Secondary | ICD-10-CM | POA: Diagnosis not present

## 2020-01-10 DIAGNOSIS — M546 Pain in thoracic spine: Secondary | ICD-10-CM | POA: Diagnosis not present

## 2020-01-10 DIAGNOSIS — M9902 Segmental and somatic dysfunction of thoracic region: Secondary | ICD-10-CM | POA: Diagnosis not present

## 2020-02-01 DIAGNOSIS — M546 Pain in thoracic spine: Secondary | ICD-10-CM | POA: Diagnosis not present

## 2020-02-01 DIAGNOSIS — M542 Cervicalgia: Secondary | ICD-10-CM | POA: Diagnosis not present

## 2020-02-01 DIAGNOSIS — M9901 Segmental and somatic dysfunction of cervical region: Secondary | ICD-10-CM | POA: Diagnosis not present

## 2020-02-01 DIAGNOSIS — M9902 Segmental and somatic dysfunction of thoracic region: Secondary | ICD-10-CM | POA: Diagnosis not present

## 2020-03-08 DIAGNOSIS — M9901 Segmental and somatic dysfunction of cervical region: Secondary | ICD-10-CM | POA: Diagnosis not present

## 2020-03-08 DIAGNOSIS — M546 Pain in thoracic spine: Secondary | ICD-10-CM | POA: Diagnosis not present

## 2020-03-08 DIAGNOSIS — M542 Cervicalgia: Secondary | ICD-10-CM | POA: Diagnosis not present

## 2020-03-08 DIAGNOSIS — M9902 Segmental and somatic dysfunction of thoracic region: Secondary | ICD-10-CM | POA: Diagnosis not present

## 2020-03-14 NOTE — Progress Notes (Signed)
Subjective:    CC: R shoulder pain  I, Molly Weber, LAT, ATC, am serving as scribe for Dr. Lynne Leader.  HPI: Pt is a 43 y/o male presenting w/ R shoulder pain.  Pt notes pain has been ongoing for 5 months. MOI: spring/summer started working out more (lifting weights), but doesn't remember a specific mechanism. He locates his pain to lateral aspect of shoulder. Pn w/ shoulder ABD. Radiating pain: No Treatments tried: ice  Additionally has some lateral elbow pain ongoing for few months without injury.  Pain is worse with grip.  Not tried much treatment for yet.  Pt also c/o L elbow pain. No known MOI. Pn around the lateral epicondyle  Pertinent review of Systems: No fevers or chills  Relevant historical information: Depression, sleep apnea, GERD   Objective:    Vitals:   03/15/20 0803  BP: 104/72  Pulse: 64  SpO2: 95%   General: Well Developed, well nourished, and in no acute distress.   MSK: Right shoulder normal-appearing nontender. Normal motion pain with abduction. Intact strength abduction external and internal rotation. Mildly positive Hawkins test negative Neer's test. Positive crossarm compression test. Positive O'Brien's test. Negative empty can test. Negative Yergason's and speeds test.  Left elbow normal-appearing normal motion normal strength.  Tender palpation lateral epicondyle.  Pain with resisted wrist extension.  Lab and Radiology Results  X-ray images right shoulder obtained today personally and independently interpreted Slightly high riding humeral head otherwise normal-appearing Await formal radiology review  Diagnostic Limited MSK Ultrasound of: Right shoulder Biceps tendon is intact.  Trace hypoechoic fluid in tendon sheath proximal biceps tendon. Subscapularis tendon intact. Supraspinatus tendon intact with increased hypoechoic fluid superficial to tendon consistent with subacromial bursitis. Infraspinatus tendon intact. AC joint  mild effusion Impression: Subacromial bursitis    Impression and Recommendations:    Assessment and Plan: 44 y.o. male with right shoulder pain due to subacromial bursitis and impingement.  Plan for physical therapy and home exercise program.  If not improving proceed with steroid injection or MRI.  Check back in 6 weeks or so.  Left elbow pain: Also lateral epicondylitis present.  Again PT and exercise program..  PDMP not reviewed this encounter. Orders Placed This Encounter  Procedures  . [place] LIMITED JOINT SPACE STRUCTURES UP RIGHT(NO LINKED CHARGES)    Standing Status:   Future    Number of Occurrences:   1    Standing Expiration Date:   09/13/2020    Order Specific Question:   Reason for Exam (SYMPTOM  OR DIAGNOSIS REQUIRED)    Answer:   Chronic right shoulder pain    Order Specific Question:   Preferred imaging location?    Answer:   [facility]  . DG Shoulder Right    Standing Status:   Future    Number of Occurrences:   1    Standing Expiration Date:   03/15/2021    Order Specific Question:   Reason for Exam (SYMPTOM  OR DIAGNOSIS REQUIRED)    Answer:   Chronic right shoulder pain    Order Specific Question:   Preferred imaging location?    Answer:   [facility]  . Ambulatory referral to Physical Therapy    Referral Priority:   Routine    Referral Type:   Physical Medicine    Referral Reason:   Specialty Services Required    Requested Specialty:   Physical Therapy   No orders of the defined types were placed  in this encounter.   Discussed warning signs or symptoms. Please see discharge instructions. Patient expresses understanding.   The above documentation has been reviewed and is accurate and complete Lynne Leader, M.D.

## 2020-03-15 ENCOUNTER — Other Ambulatory Visit: Payer: Self-pay

## 2020-03-15 ENCOUNTER — Ambulatory Visit (INDEPENDENT_AMBULATORY_CARE_PROVIDER_SITE_OTHER): Payer: BC Managed Care – PPO

## 2020-03-15 ENCOUNTER — Ambulatory Visit: Payer: Self-pay

## 2020-03-15 ENCOUNTER — Ambulatory Visit: Payer: BC Managed Care – PPO | Admitting: Family Medicine

## 2020-03-15 VITALS — BP 104/72 | HR 64 | Ht 73.0 in | Wt 229.0 lb

## 2020-03-15 DIAGNOSIS — M25511 Pain in right shoulder: Secondary | ICD-10-CM

## 2020-03-15 DIAGNOSIS — G8929 Other chronic pain: Secondary | ICD-10-CM

## 2020-03-15 DIAGNOSIS — M7712 Lateral epicondylitis, left elbow: Secondary | ICD-10-CM

## 2020-03-15 NOTE — Patient Instructions (Addendum)
Thank you for coming in today.  Please get an Xray today before you leave  I've referred you to Physical Therapy.  Let us know if you don't hear from them in one week. View at [email or web address] using code: [id number]  Recheck in about 6 weeks or so.   Let me know sooner if this is not working out.

## 2020-03-16 NOTE — Progress Notes (Signed)
X-ray right shoulder looks like you had a previous shoulder dislocation otherwise looks okay now.

## 2020-03-20 ENCOUNTER — Other Ambulatory Visit: Payer: Self-pay

## 2020-03-20 ENCOUNTER — Ambulatory Visit (INDEPENDENT_AMBULATORY_CARE_PROVIDER_SITE_OTHER): Payer: BC Managed Care – PPO | Admitting: Rehabilitative and Restorative Service Providers"

## 2020-03-20 DIAGNOSIS — G8929 Other chronic pain: Secondary | ICD-10-CM | POA: Diagnosis not present

## 2020-03-20 DIAGNOSIS — M6281 Muscle weakness (generalized): Secondary | ICD-10-CM | POA: Diagnosis not present

## 2020-03-20 DIAGNOSIS — R293 Abnormal posture: Secondary | ICD-10-CM

## 2020-03-20 DIAGNOSIS — M25511 Pain in right shoulder: Secondary | ICD-10-CM

## 2020-03-20 NOTE — Therapy (Signed)
[facility] [place], [place], [address] Phone: [phone]   Fax:  [phone]  Physical Therapy Evaluation  Patient Details  Name: Thomas Duran MRN: [id number] Date of Birth: 10-24-1975 Referring Provider (PT): Lynne Leader, MD   Encounter Date: 03/20/2020   PT End of Session - 03/20/20 2246    Visit Number 1    Number of Visits 12    Date for PT Re-Evaluation 05/01/20    Authorization Type BCBS    PT Start Time 1318    PT Stop Time 1400    PT Time Calculation (min) 42 min    Activity Tolerance Patient tolerated treatment well    Behavior During Therapy [facility] for tasks assessed/performed           Past Medical History:  Diagnosis Date  . Generalized anxiety disorder   . GERD (gastroesophageal reflux disease)   . Hypogonadism in male    in past - Managed by endo - Dr. Buddy Duty. self weaned from meds and no signifcant change in symptoms.     Past Surgical History:  Procedure Laterality Date  . WISDOM TOOTH EXTRACTION      There were no vitals filed for this visit.    Subjective Assessment - 03/20/20 1514    Subjective The patient reports onset of R shoulder pain in August 2021.  He had started resistance training with a personal trainer and was working on [employer] with rotation.  He is not currently lifting weights--trying to allow for the R shoulder to heal.  He feels pain has improved since initial onset.  Patient has pain worse with abduction, overhead, and ER movements.    Patient Stated Goals Reduce shoulder pain    Currently in Pain? Yes    Pain Score 0-No pain    Pain Location Neck    Pain Orientation Right;Left    Pain Descriptors / Indicators Tightness    Pain Onset More than a month ago    Pain Frequency Intermittent    Aggravating Factors  turning is 7-8/10 pain to both sides (in suboccipitals)    Multiple Pain Sites Yes    Pain Score 0    Pain Location Shoulder    Pain  Orientation Right    Pain Descriptors / Indicators Sore    Pain Type Chronic pain    Pain Onset More than a month ago    Pain Frequency Intermittent    Aggravating Factors  overhead reach, abduction, external rotation              [facility] PT Assessment - 03/20/20 1526      Assessment   Medical Diagnosis Chronic R shoulder pain    Referring Provider (PT) Lynne Leader, MD    Onset Date/Surgical Date --   11/2019   Hand Dominance Right      Restrictions   Weight Bearing Restrictions No      Balance Screen   Has the patient fallen in the past 6 months No    Has the patient had a decrease in activity level because of a fear of falling?  No    Is the patient reluctant to leave their home because of a fear of falling?  No      Home Ecologist residence      Prior Function   Level of Independence Independent    Vocation Full time employment    Vocation Requirements sitting at a desk,  driving      Observation/Other Assessments   Focus on Therapeutic Outcomes (FOTO)  44% Limitation      Sensation   Light Touch Appears Intact      ROM / Strength   AROM / PROM / Strength AROM;Strength      AROM   Overall AROM  Deficits    AROM Assessment Site Shoulder    Right/Left Shoulder Right;Left    Right Shoulder Flexion 130 Degrees   pain begins at 130 and he can go to 156 flexion   Right Shoulder ABduction 120 Degrees   pain begins and he can go to 150 degrees abduction   Right Shoulder Internal Rotation 70 Degrees    Right Shoulder External Rotation 90 Degrees   pain initially with movement, then clears     Strength   Overall Strength Deficits    Strength Assessment Site Shoulder    Right/Left Shoulder Right    Right Shoulder Flexion 5/5    Right Shoulder ABduction 5/5    Right Shoulder Internal Rotation 5/5    Right Shoulder External Rotation 4/5   with pain     Palpation   Palpation comment tenderness to palpation anterior deltoic, bicipital  groove, latissimus and teres      Special Tests    Special Tests Rotator Cuff Impingement    Other special tests resisted ER +    Rotator Cuff Impingment tests Michel Bickers test;Lift- off test;Empty Can test;Full Can test      Hawkins-Kennedy test   Findings Positive    Side Right      Lift-Off test   Findings Positive    Side Right      Empty Can test   Findings Negative    Side Right      Full Can test   Findings Negative    Side Right                      Objective measurements completed on examination: See above findings.       [facility] Adult PT Treatment/Exercise - 03/20/20 2241      Exercises   Exercises Shoulder      Shoulder Exercises: Prone   Retraction Strengthening;Both;10 reps    Extension Strengthening;Both;10 reps      Shoulder Exercises: Standing   External Rotation Strengthening;Right;10 reps    Theraband Level (Shoulder External Rotation) Level 2 (Red)    Internal Rotation Strengthening;Right;10 reps    Theraband Level (Shoulder Internal Rotation) Level 2 (Red)                  PT Education - 03/20/20 1548    Education Details HEP    Person(s) Educated Patient    Methods Explanation;Demonstration;Handout    Comprehension Returned demonstration;Verbalized understanding               PT Long Term Goals - 03/20/20 2246      PT LONG TERM GOAL #1   Title The patient will be indep with HEP for R shoulder flexibility and strengthening.    Time 6    Period Weeks    Target Date 05/01/20      PT LONG TERM GOAL #2   Title The patient will reduce functional limitation per FOTO from 44% to < or equal to 26%.    Time 6    Period Weeks    Target Date 05/01/20      PT LONG TERM GOAL #3   Title  The patient will have pain free R shoulder flexion to 160 degrees.    Time 6    Period Weeks    Target Date 05/01/20      PT LONG TERM GOAL #4   Title The patient will report no neck pain.    Time 6    Period Weeks     Target Date 05/01/20      PT LONG TERM GOAL #5   Title The patient will verbalize understanding of gym activities for post d/c program.    Time 6    Period Weeks    Target Date 05/01/20                  Plan - 03/20/20 2254    Clinical Impression Statement The aptient is a 44 yo male presenting to OP physical therapy with 3+ month h/o R shoulder pain.  He presents with increased pain with overhead flexion, abduction and resisted ER.  He has dec'd A/ROM as compared to L shoulder and postural changes with forward shoulders positioning.  He has myofascial tightness noted in pectoralis, deltoid, biceps and parascapula rmusculature.  PT to address R shoulder and neck as indicated to return to prior functional status.    Examination-Activity Limitations Lift;Reach Overhead    Examination-Participation Restrictions Community Activity    Stability/Clinical Decision Making Stable/Uncomplicated    Clinical Decision Making Low    Rehab Potential Good    PT Frequency 2x / week    PT Duration 6 weeks    PT Treatment/Interventions ADLs/Self Care Home Management;Therapeutic activities;Therapeutic exercise;Patient/family education;Taping;Dry needling;Manual techniques;Moist Heat;Cryotherapy;Electrical Stimulation;Passive range of motion    PT Next Visit Plan STM or DN (need to provide patient education re: DN) for myofascial tightness, postural stretching, scapular strengthening, isometrics and reactive isometrics to tolerance, strengthening to tolerance.    PT Home Exercise Plan Access Code: [email or web address]    Consulted and Agree with Plan of Care Patient           Patient will benefit from skilled therapeutic intervention in order to improve the following deficits and impairments:  Pain, Increased fascial restricitons, Decreased range of motion, Decreased strength, Decreased activity tolerance, Postural dysfunction, Impaired flexibility, Hypomobility  Visit Diagnosis: Chronic right shoulder  pain  Muscle weakness (generalized)  Abnormal posture     Problem List Patient Active Problem List   Diagnosis Date Noted  . Major depression in full remission ([place]) 02/12/2017  . Chronic cough 06/29/2014  . Hyperlipidemia 03/14/2014  . OSA (obstructive sleep apnea) 11/08/2011  . MRSA infection 03/29/2011  . GAD (generalized anxiety disorder) 02/13/2011  . Gastroesophageal reflux disease 02/13/2011  . Hypogonadism male 02/13/2011    [place], PT 03/20/2020, 11:03 PM  [facility] [place], [place], [address] Phone: [phone]   Fax:  [phone]  Name: Thomas Duran MRN: [id number] Date of Birth: 10-12-1975

## 2020-03-20 NOTE — Patient Instructions (Signed)
Access Code: [email or web address] URL: [email or web address] Date: 03/20/2020 Prepared by: Rudell Cobb  Exercises Doorway Pec Stretch at 60 Elevation - 2 x daily - 7 x weekly - 1 sets - 2-3 reps - 30 seconds hold Shoulder External Rotation with Anchored Resistance - 2 x daily - 7 x weekly - 1 sets - 10 reps Standing Shoulder Internal Rotation with Anchored Resistance - 2 x daily - 7 x weekly - 1 sets - 10 reps Prone Scapular Slide with Shoulder Extension - 2 x daily - 7 x weekly - 1 sets - 10 reps Prone Scapular Retraction in Abduction - 2 x daily - 7 x weekly - 1 sets - 10 reps

## 2020-03-22 ENCOUNTER — Encounter: Payer: Self-pay | Admitting: Physical Therapy

## 2020-03-22 ENCOUNTER — Other Ambulatory Visit: Payer: Self-pay

## 2020-03-22 ENCOUNTER — Ambulatory Visit: Payer: BC Managed Care – PPO | Admitting: Physical Therapy

## 2020-03-22 DIAGNOSIS — G8929 Other chronic pain: Secondary | ICD-10-CM

## 2020-03-22 DIAGNOSIS — M25511 Pain in right shoulder: Secondary | ICD-10-CM

## 2020-03-22 DIAGNOSIS — M6281 Muscle weakness (generalized): Secondary | ICD-10-CM

## 2020-03-22 DIAGNOSIS — R293 Abnormal posture: Secondary | ICD-10-CM | POA: Diagnosis not present

## 2020-03-22 NOTE — Therapy (Signed)
[facility] [place], [place], [address] Phone: [phone]   Fax:  [phone]  Physical Therapy Treatment  Patient Details  Name: Thomas Duran MRN: [id number] Date of Birth: 20-Dec-1975 Referring Provider (PT): Lynne Leader, MD   Encounter Date: 03/22/2020   PT End of Session - 03/22/20 0802    Visit Number 2    Number of Visits 12    Date for PT Re-Evaluation 05/01/20    Authorization Type BCBS    PT Start Time 0803    PT Stop Time 0845    PT Time Calculation (min) 42 min    Activity Tolerance Patient tolerated treatment well    Behavior During Therapy [facility] for tasks assessed/performed           Past Medical History:  Diagnosis Date  . Generalized anxiety disorder   . GERD (gastroesophageal reflux disease)   . Hypogonadism in male    in past - Managed by endo - Dr. Buddy Duty. self weaned from meds and no signifcant change in symptoms.     Past Surgical History:  Procedure Laterality Date  . WISDOM TOOTH EXTRACTION      There were no vitals filed for this visit.   Subjective Assessment - 03/22/20 0810    Subjective Pt reports no pain in Rt shoulder at rest, but continued pain up to 8/10 with abdct.  No new changes since last visit.  Performing HEP 1x/day.    Patient Stated Goals Reduce shoulder pain    Currently in Pain? No/denies    Pain Score 0-No pain    Pain Onset More than a month ago    Pain Onset More than a month ago              [facility] PT Assessment - 03/22/20 0001      Assessment   Medical Diagnosis Chronic R shoulder pain    Referring Provider (PT) Lynne Leader, MD    Onset Date/Surgical Date --   11/2019   Hand Dominance Right             [facility] Adult PT Treatment/Exercise - 03/22/20 0001      Shoulder Exercises: Seated   Other Seated Exercises W's x 5 sec x 5 reps       Shoulder Exercises: Prone   Extension Strengthening;Both;10 reps    Other Prone Exercises W's x 8  reps - stopped due to increased pain in bilat shoulders, tactile cues for form.       Shoulder Exercises: Standing   External Rotation Strengthening;Right;10 reps    Theraband Level (Shoulder External Rotation) Level 2 (Red)   3 sec pause in retraction   External Rotation Limitations 2nd set, completed bilat x 10 red band, cues for scap retraction.     Internal Rotation Strengthening;Right;10 reps    Theraband Level (Shoulder Internal Rotation) Level 2 (Red)    Row --    Theraband Level (Shoulder Row) --    Other Standing Exercises scap retraction with back against pool noodle x 5 reps of 5 sec holds.       Shoulder Exercises: ROM/Strengthening   UBE (Upper Arm Bike) L2: 1 min each direction, standing.       Shoulder Exercises: Stretch   Other Shoulder Stretches 3 position doorway stretch x 15-30 sec each position, high position performed unilaterally.  bilat bicep stretch holding door frame x 20 sec.     Other Shoulder Stretches L stretch with hands  on stair railing x 20 sec x 3 reps.  Thoracic ext over back of chair (hands supporting head) x 10 sec.                   PT Education - 03/22/20 0851    Education Details HEP-updated.  (given code/app info); DN info.  Postural ed.    Person(s) Educated Patient    Methods Explanation;Demonstration;Handout;Tactile cues;Verbal cues    Comprehension Verbalized understanding;Returned demonstration               PT Long Term Goals - 03/20/20 2246      PT LONG TERM GOAL #1   Title The patient will be indep with HEP for R shoulder flexibility and strengthening.    Time 6    Period Weeks    Target Date 05/01/20      PT LONG TERM GOAL #2   Title The patient will reduce functional limitation per FOTO from 44% to < or equal to 26%.    Time 6    Period Weeks    Target Date 05/01/20      PT LONG TERM GOAL #3   Title The patient will have pain free R shoulder flexion to 160 degrees.    Time 6    Period Weeks    Target Date  05/01/20      PT LONG TERM GOAL #4   Title The patient will report no neck pain.    Time 6    Period Weeks    Target Date 05/01/20      PT LONG TERM GOAL #5   Title The patient will verbalize understanding of gym activities for post d/c program.    Time 6    Period Weeks    Target Date 05/01/20                 Plan - 03/22/20 0806    Clinical Impression Statement Limited tolerance to unilateral high doorway stretch with RUE, and prone W's.  HEP modified and pt educated throughout session regarding purpose of exercises and importance of improved posture.  Goals are ongoing.    Examination-Activity Limitations Lift;Reach Overhead    Examination-Participation Restrictions Community Activity    Stability/Clinical Decision Making Stable/Uncomplicated    Rehab Potential Good    PT Frequency 2x / week    PT Duration 6 weeks    PT Treatment/Interventions ADLs/Self Care Home Management;Therapeutic activities;Therapeutic exercise;Patient/family education;Taping;Dry needling;Manual techniques;Moist Heat;Cryotherapy;Electrical Stimulation;Passive range of motion    PT Next Visit Plan postural strengthening and stretching; manual/DN for myofascial tightness.  assess response to HEP modification.    PT Home Exercise Plan Access Code: [email or web address]    Consulted and Agree with Plan of Care Patient           Patient will benefit from skilled therapeutic intervention in order to improve the following deficits and impairments:  Pain, Increased fascial restricitons, Decreased range of motion, Decreased strength, Decreased activity tolerance, Postural dysfunction, Impaired flexibility, Hypomobility  Visit Diagnosis: Chronic right shoulder pain  Muscle weakness (generalized)  Abnormal posture     Problem List Patient Active Problem List   Diagnosis Date Noted  . Major depression in full remission ([place]) 02/12/2017  . Chronic cough 06/29/2014  . Hyperlipidemia 03/14/2014  . OSA  (obstructive sleep apnea) 11/08/2011  . MRSA infection 03/29/2011  . GAD (generalized anxiety disorder) 02/13/2011  . Gastroesophageal reflux disease 02/13/2011  . Hypogonadism male 02/13/2011   Kerin Perna, PTA 03/22/20 9:05 AM  [facility] [place] [facility] [place], [place], [address] Phone: [phone]   Fax:  [phone]  Name: Thomas Duran MRN: [id number] Date of Birth: March 29, 1976

## 2020-03-22 NOTE — Patient Instructions (Addendum)
Trigger Point Dry Needling  . What is Trigger Point Dry Needling (DN)? o DN is a physical therapy technique used to treat muscle pain and dysfunction. Specifically, DN helps deactivate muscle trigger points (muscle knots).  o A thin filiform needle is used to penetrate the skin and stimulate the underlying trigger point. The goal is for a local twitch response (LTR) to occur and for the trigger point to relax. No medication of any kind is injected during the procedure.   . What Does Trigger Point Dry Needling Feel Like?  o The procedure feels different for each individual patient. Some patients report that they do not actually feel the needle enter the skin and overall the process is not painful. Very mild bleeding may occur. However, many patients feel a deep cramping in the muscle in which the needle was inserted. This is the local twitch response.   Marland Kitchen How Will I feel after the treatment? o Soreness is normal, and the onset of soreness may not occur for a few hours. Typically this soreness does not last longer than two days.  o Bruising is uncommon, however; ice can be used to decrease any possible bruising.  o In rare cases feeling tired or nauseous after the treatment is normal. In addition, your symptoms may get worse before they get better, this period will typically not last longer than 24 hours.   . What Can I do After My Treatment? o Increase your hydration by drinking more water for the next 24 hours. o You may place ice or heat on the areas treated that have become sore, however, do not use heat on inflamed or bruised areas. Heat often brings more relief post needling. o You can continue your regular activities, but vigorous activity is not recommended initially after the treatment for 24 hours. o DN is best combined with other physical therapy such as strengthening, stretching, and other therapies.    Access Code: [email or web address]: [email or web address]:  12/08/2021Prepared by: [facility] KernersvilleExercises  Prone Scapular Slide with Shoulder Extension - 2 x daily - 7 x weekly - 1 sets - 10 reps  Standing Shoulder External Rotation with Resistance - 2 x daily - 7 x weekly - 1 sets - 10 reps - 3 second hold  Seated Scapular Retraction - 2 x daily - 7 x weekly - 1 sets - 5 reps - 5 hold  Standing 'L' Stretch at Counter - 2 x daily - 7 x weekly - 1 sets - 3 reps - 15 hold  Doorway Pec Stretch at 90 Degrees Abduction - 2 x daily - 7 x weekly - 1 sets - 2-3 reps - 15-30 seconds hold  Seated Thoracic Lumbar Extension with Pectoralis Stretch - 1 x daily - 7 x weekly - 1 sets - 2-3 reps - 5 seconds hold  W's - this is a stick up position - 1 x daily - 7 x weekly - 1 sets - 3-5 reps - 5-10 seconds hold

## 2020-03-28 ENCOUNTER — Other Ambulatory Visit: Payer: Self-pay

## 2020-03-28 ENCOUNTER — Ambulatory Visit: Payer: BC Managed Care – PPO | Admitting: Physical Therapy

## 2020-03-28 DIAGNOSIS — M25511 Pain in right shoulder: Secondary | ICD-10-CM

## 2020-03-28 DIAGNOSIS — M6281 Muscle weakness (generalized): Secondary | ICD-10-CM

## 2020-03-28 DIAGNOSIS — R293 Abnormal posture: Secondary | ICD-10-CM

## 2020-03-28 DIAGNOSIS — G8929 Other chronic pain: Secondary | ICD-10-CM

## 2020-03-28 NOTE — Patient Instructions (Signed)
Access Code: [email or web address] URL: [email or web address] Date: 03/28/2020 Prepared by: Isabelle Course  Exercises Prone Scapular Slide with Shoulder Extension - 2 x daily - 7 x weekly - 1 sets - 10 reps Standing Shoulder External Rotation with Resistance - 2 x daily - 7 x weekly - 1 sets - 10 reps - 3 second hold Standing 'L' Stretch at Counter - 2 x daily - 7 x weekly - 1 sets - 3 reps - 15 hold Doorway Pec Stretch at 90 Degrees Abduction - 2 x daily - 7 x weekly - 1 sets - 2-3 reps - 15-30 seconds hold Seated Thoracic Lumbar Extension with Pectoralis Stretch - 1 x daily - 7 x weekly - 1 sets - 2-3 reps - 5 seconds hold W's - this is a stick up position - 1 x daily - 7 x weekly - 1 sets - 3-5 reps - 5-10 seconds hold Standing Shoulder Horizontal Abduction with Resistance - 1 x daily - 7 x weekly - 3 sets - 10 reps

## 2020-03-28 NOTE — Therapy (Signed)
[place], [place], [address] Phone: [phone]   Fax:  [phone]  Physical Therapy Treatment  Patient Details  Name: Thomas Duran MRN: [id number] Date of Birth: 1976-02-07 Referring Provider (PT): Marikay Alar Date: 03/28/2020   PT End of Session - 03/28/20 0931    Visit Number 3    Number of Visits 12    Date for PT Re-Evaluation 05/01/20    Authorization Type BCBS    PT Start Time 6789    PT Stop Time 0927    PT Time Calculation (min) 40 min    Activity Tolerance Patient tolerated treatment well           Past Medical History:  Diagnosis Date  . Generalized anxiety disorder   . GERD (gastroesophageal reflux disease)   . Hypogonadism in male    in past - Managed by endo - Dr. Buddy Duty. self weaned from meds and no signifcant change in symptoms.     Past Surgical History:  Procedure Laterality Date  . WISDOM TOOTH EXTRACTION      There were no vitals filed for this visit.   Subjective Assessment - 03/28/20 0933    Subjective Pt reports his shoulder feels "about the same". Still pain with overhead activity    Patient Stated Goals Reduce shoulder pain    Currently in Pain? No/denies    Pain Score 0-No pain    Pain Onset More than a month ago              [facility] PT Assessment - 03/28/20 0001      Assessment   Medical Diagnosis Chronic R shoulder pain    Referring Provider (PT) Georgina Snell    Onset Date/Surgical Date --   11/2019   Hand Dominance Right                         [facility] Adult PT Treatment/Exercise - 03/28/20 0001      Shoulder Exercises: Supine   Protraction Strengthening;Right;20 reps;Weights    Protraction Weight (lbs) 3lbs      Shoulder Exercises: Seated   Other Seated Exercises W 5 sec x 5      Shoulder Exercises: Standing   Horizontal ABduction Strengthening;Both;20 reps    Theraband Level (Shoulder Horizontal ABduction) Level 2  (Red)    External Rotation Strengthening;Both;20 reps    Theraband Level (Shoulder External Rotation) Level 2 (Red)   cues for scapular retraction   Internal Rotation Strengthening;Right;20 reps    Theraband Level (Shoulder Internal Rotation) Level 2 (Red)      Shoulder Exercises: ROM/Strengthening   UBE (Upper Arm Bike) L2: 2 min each direction      Shoulder Exercises: Stretch   Other Shoulder Stretches 3 position doorway stretch 15-30 sec x 2 each position    Other Shoulder Stretches thoracic stretch over back of chair 5 x 5 seconds      Manual Therapy   Manual Therapy Soft tissue mobilization;Passive ROM    Manual therapy comments to decrease pain and improve mobility    Soft tissue mobilization Rt shoulder deltoids, pec major and minor, subscap    Passive ROM Rt shoulder ER/IR                  PT Education - 03/28/20 0931    Education Details updated HEP    Person(s) Educated Patient    Methods Explanation;Demonstration    Comprehension  Verbalized understanding;Returned demonstration               PT Long Term Goals - 03/20/20 2246      PT LONG TERM GOAL #1   Title The patient will be indep with HEP for R shoulder flexibility and strengthening.    Time 6    Period Weeks    Target Date 05/01/20      PT LONG TERM GOAL #2   Title The patient will reduce functional limitation per FOTO from 44% to < or equal to 26%.    Time 6    Period Weeks    Target Date 05/01/20      PT LONG TERM GOAL #3   Title The patient will have pain free R shoulder flexion to 160 degrees.    Time 6    Period Weeks    Target Date 05/01/20      PT LONG TERM GOAL #4   Title The patient will report no neck pain.    Time 6    Period Weeks    Target Date 05/01/20      PT LONG TERM GOAL #5   Title The patient will verbalize understanding of gym activities for post d/c program.    Time 6    Period Weeks    Target Date 05/01/20                 Plan - 03/28/20 0931     Clinical Impression Statement Pt with good response to manual therapy and PROM.  Updated HEP for protraction and horizontal abduction    Examination-Activity Limitations Lift;Reach Overhead    Examination-Participation Restrictions Community Activity    Stability/Clinical Decision Making Stable/Uncomplicated    Rehab Potential Good    PT Frequency 2x / week    PT Duration 6 weeks    PT Treatment/Interventions ADLs/Self Care Home Management;Therapeutic activities;Therapeutic exercise;Patient/family education;Taping;Dry needling;Manual techniques;Moist Heat;Cryotherapy;Electrical Stimulation;Passive range of motion    PT Next Visit Plan continue postural strengthening, manual for myofascial tightness    PT Home Exercise Plan Access Code: [email or web address]    Consulted and Agree with Plan of Care Patient           Patient will benefit from skilled therapeutic intervention in order to improve the following deficits and impairments:  Pain,Increased fascial restricitons,Decreased range of motion,Decreased strength,Decreased activity tolerance,Postural dysfunction,Impaired flexibility,Hypomobility  Visit Diagnosis: Chronic right shoulder pain  Muscle weakness (generalized)  Abnormal posture     Problem List Patient Active Problem List   Diagnosis Date Noted  . Major depression in full remission ([place]) 02/12/2017  . Chronic cough 06/29/2014  . Hyperlipidemia 03/14/2014  . OSA (obstructive sleep apnea) 11/08/2011  . MRSA infection 03/29/2011  . GAD (generalized anxiety disorder) 02/13/2011  . Gastroesophageal reflux disease 02/13/2011  . Hypogonadism male 02/13/2011   Isabelle Course, PT  Brieann Osinski 03/28/2020, 9:35 AM  [facility] [place], [place], [address] Phone: [phone]   Fax:  [phone]  Name: Thomas Duran MRN: [id number] Date of Birth: 1975-05-16

## 2020-03-30 ENCOUNTER — Other Ambulatory Visit: Payer: Self-pay

## 2020-03-30 ENCOUNTER — Encounter: Payer: Self-pay | Admitting: Rehabilitative and Restorative Service Providers"

## 2020-03-30 ENCOUNTER — Ambulatory Visit (INDEPENDENT_AMBULATORY_CARE_PROVIDER_SITE_OTHER): Payer: BC Managed Care – PPO | Admitting: Rehabilitative and Restorative Service Providers"

## 2020-03-30 DIAGNOSIS — R293 Abnormal posture: Secondary | ICD-10-CM | POA: Diagnosis not present

## 2020-03-30 DIAGNOSIS — M25511 Pain in right shoulder: Secondary | ICD-10-CM | POA: Diagnosis not present

## 2020-03-30 DIAGNOSIS — G8929 Other chronic pain: Secondary | ICD-10-CM

## 2020-03-30 DIAGNOSIS — M6281 Muscle weakness (generalized): Secondary | ICD-10-CM

## 2020-03-30 NOTE — Patient Instructions (Signed)
Access Code: [email or web address] URL: [email or web address] Date: 03/30/2020 Prepared by: Rudell Cobb  Exercises Prone Scapular Slide with Shoulder Extension - 2 x daily - 7 x weekly - 1 sets - 10 reps Standing 'L' Stretch at Counter - 2 x daily - 7 x weekly - 1 sets - 3 reps - 15 hold Doorway Pec Stretch at 90 Degrees Abduction - 2 x daily - 7 x weekly - 1 sets - 2-3 reps - 15-30 seconds hold Seated Thoracic Lumbar Extension with Pectoralis Stretch - 1 x daily - 7 x weekly - 1 sets - 2-3 reps - 5 seconds hold W's - this is a stick up position - 1 x daily - 7 x weekly - 1 sets - 3-5 reps - 5-10 seconds hold Standing Shoulder External Rotation with Resistance - 2 x daily - 7 x weekly - 1 sets - 10 reps - 3 second hold Standing Shoulder Horizontal Abduction with Resistance - 1 x daily - 7 x weekly - 3 sets - 10 reps Scaption with Resistance - 2 x daily - 7 x weekly - 1 sets - 10 reps

## 2020-03-30 NOTE — Therapy (Signed)
[place], [place], [address] Phone: [phone]   Fax:  [phone]  Physical Therapy Treatment  Patient Details  Name: Thomas Duran MRN: [id number] Date of Birth: March 04, 1976 Referring Provider (PT): Lynne Leader, MD   Encounter Date: 03/30/2020   PT End of Session - 03/30/20 1538    Visit Number 4    Number of Visits 12    Date for PT Re-Evaluation 05/01/20    Authorization Type BCBS    PT Start Time 1454    PT Stop Time 1532    PT Time Calculation (min) 38 min    Activity Tolerance Patient tolerated treatment well    Behavior During Therapy [facility] for tasks assessed/performed           Past Medical History:  Diagnosis Date  . Generalized anxiety disorder   . GERD (gastroesophageal reflux disease)   . Hypogonadism in male    in past - Managed by endo - Dr. Buddy Duty. self weaned from meds and no signifcant change in symptoms.     Past Surgical History:  Procedure Laterality Date  . WISDOM TOOTH EXTRACTION      There were no vitals filed for this visit.   Subjective Assessment - 03/30/20 1455    Subjective The patient feels better overall since last session.  He notes massage loosened tightness in shoulder.    Patient Stated Goals Reduce shoulder pain    Currently in Pain? No/denies              [facility] PT Assessment - 03/30/20 1457      Assessment   Medical Diagnosis Chronic R shoulder pain    Referring Provider (PT) Lynne Leader, MD                         [facility] Adult PT Treatment/Exercise - 03/30/20 1457      Exercises   Exercises Shoulder      Shoulder Exercises: Prone   Retraction Strengthening;Right;10 reps    Retraction Limitations 2 sets    Flexion Limitations painful    Extension Strengthening;Right;10 reps    Extension Weight (lbs) 2    Extension Limitations first set no weight; 2nd rep 2 lbs    External Rotation Strengthening;Right;10 reps     External Rotation Limitations first set no weight; 2nd rep 2 lbs    Horizontal ABduction 1 Strengthening;Right;10 reps   2 sets   Horizontal ABduction 1 Limitations first set no weight; 2nd rep 2 lbs      Shoulder Exercises: Standing   Flexion Strengthening;Right;10 reps    Theraband Level (Shoulder Flexion) Level 3 (Green)    Flexion Limitations scaption to shoulder height      Shoulder Exercises: ROM/Strengthening   UBE (Upper Arm Bike) L2: 2 minutes forward/1 minute backwards      Manual Therapy   Manual Therapy Soft tissue mobilization;Passive ROM    Manual therapy comments skilled palpation to assess response to dry needling; STM    Soft tissue mobilization STM for bilat upper trap and R deltoids/parascapular musculature            Trigger Point Dry Needling - 03/30/20 1547    Consent Given? Yes    Education Handout Provided Previously provided    Muscles Treated Head and Neck Upper trapezius    Muscles Treated Upper Quadrant Deltoid;Biceps    Dry Needling Comments bilat upper trap; R UE  Upper Trapezius Response Twitch reponse elicited;Palpable increased muscle length                PT Education - 03/30/20 1538    Education Details HEP    Person(s) Educated Patient    Methods Explanation;Demonstration;Handout    Comprehension Verbalized understanding;Returned demonstration               PT Long Term Goals - 03/20/20 2246      PT LONG TERM GOAL #1   Title The patient will be indep with HEP for R shoulder flexibility and strengthening.    Time 6    Period Weeks    Target Date 05/01/20      PT LONG TERM GOAL #2   Title The patient will reduce functional limitation per FOTO from 44% to < or equal to 26%.    Time 6    Period Weeks    Target Date 05/01/20      PT LONG TERM GOAL #3   Title The patient will have pain free R shoulder flexion to 160 degrees.    Time 6    Period Weeks    Target Date 05/01/20      PT LONG TERM GOAL #4   Title The  patient will report no neck pain.    Time 6    Period Weeks    Target Date 05/01/20      PT LONG TERM GOAL #5   Title The patient will verbalize understanding of gym activities for post d/c program.    Time 6    Period Weeks    Target Date 05/01/20                 Plan - 03/30/20 1541    Clinical Impression Statement The patient responded well to DN in upper trapezius musculature.  He has improved motion for flexion, but continues with pain at end range.  Plan to continue working on STM/DN for increased muscle length, strengthening, and flexibility.    Examination-Activity Limitations Lift;Reach Overhead    Examination-Participation Restrictions Community Activity    Stability/Clinical Decision Making Stable/Uncomplicated    Rehab Potential Good    PT Frequency 2x / week    PT Duration 6 weeks    PT Treatment/Interventions ADLs/Self Care Home Management;Therapeutic activities;Therapeutic exercise;Patient/family education;Taping;Dry needling;Manual techniques;Moist Heat;Cryotherapy;Electrical Stimulation;Passive range of motion    PT Next Visit Plan continue postural strengthening, manual for myofascial tightness    PT Home Exercise Plan Access Code: [email or web address]    Consulted and Agree with Plan of Care Patient           Patient will benefit from skilled therapeutic intervention in order to improve the following deficits and impairments:  Pain,Increased fascial restricitons,Decreased range of motion,Decreased strength,Decreased activity tolerance,Postural dysfunction,Impaired flexibility,Hypomobility  Visit Diagnosis: Chronic right shoulder pain  Muscle weakness (generalized)  Abnormal posture     Problem List Patient Active Problem List   Diagnosis Date Noted  . Major depression in full remission ([place]) 02/12/2017  . Chronic cough 06/29/2014  . Hyperlipidemia 03/14/2014  . OSA (obstructive sleep apnea) 11/08/2011  . MRSA infection 03/29/2011  . GAD (generalized  anxiety disorder) 02/13/2011  . Gastroesophageal reflux disease 02/13/2011  . Hypogonadism male 02/13/2011    Charnese Federici 03/30/2020, 3:48 PM  [facility] [place], [place], [address] Phone: [phone]   Fax:  [phone]  Name: DAEMYN GARIEPY MRN: [id number] Date of Birth: 1975/08/23

## 2020-04-04 ENCOUNTER — Other Ambulatory Visit: Payer: Self-pay

## 2020-04-04 ENCOUNTER — Ambulatory Visit (INDEPENDENT_AMBULATORY_CARE_PROVIDER_SITE_OTHER): Payer: BC Managed Care – PPO | Admitting: Physical Therapy

## 2020-04-04 ENCOUNTER — Encounter: Payer: Self-pay | Admitting: Physical Therapy

## 2020-04-04 DIAGNOSIS — R293 Abnormal posture: Secondary | ICD-10-CM

## 2020-04-04 DIAGNOSIS — G8929 Other chronic pain: Secondary | ICD-10-CM

## 2020-04-04 DIAGNOSIS — M6281 Muscle weakness (generalized): Secondary | ICD-10-CM

## 2020-04-04 DIAGNOSIS — M25511 Pain in right shoulder: Secondary | ICD-10-CM | POA: Diagnosis not present

## 2020-04-04 NOTE — Therapy (Signed)
[place], [place], [address] Phone: [phone]   Fax:  [phone]  Physical Therapy Treatment  Patient Details  Name: Thomas Duran MRN: [id number] Date of Birth: Dec 26, 1975 Referring Provider (PT): Lynne Leader, MD   Encounter Date: 04/04/2020   PT End of Session - 04/04/20 0759    Visit Number 5    Number of Visits 12    Date for PT Re-Evaluation 05/01/20    Authorization Type BCBS    PT Start Time 0800    PT Stop Time 0842    PT Time Calculation (min) 42 min    Activity Tolerance Patient tolerated treatment well    Behavior During Therapy [facility] for tasks assessed/performed           Past Medical History:  Diagnosis Date  . Generalized anxiety disorder   . GERD (gastroesophageal reflux disease)   . Hypogonadism in male    in past - Managed by endo - Dr. Buddy Duty. self weaned from meds and no signifcant change in symptoms.     Past Surgical History:  Procedure Laterality Date  . WISDOM TOOTH EXTRACTION      There were no vitals filed for this visit.   Subjective Assessment - 04/04/20 0801    Subjective Pt reports that the DN releived some tight muscles.  Still having pain with raising the the Rt arm into abduction    Patient Stated Goals Reduce shoulder pain    Currently in Pain? No/denies              [facility] PT Assessment - 04/04/20 0001      Assessment   Medical Diagnosis Chronic R shoulder pain    Referring Provider (PT) Lynne Leader, MD                         [facility] Adult PT Treatment/Exercise - 04/04/20 0001      Exercises   Exercises Shoulder      Shoulder Exercises: Supine   Other Supine Exercises head presses into pillow, lower trunk rotation in hooklying    Other Supine Exercises 10x5sec elbow presses with hands behind head.      Shoulder Exercises: Sidelying   External Rotation Strengthening;Right;10 reps;Weights   3 sets, towel under arm    External Rotation Weight (lbs) 3    Other Sidelying Exercises 3x10 empty can with 2#      Shoulder Exercises: ROM/Strengthening   UBE (Upper Arm Bike) L2x4' alt FWD/BWD      Shoulder Exercises: Stretch   Other Shoulder Stretches hooklying goal post/scarecrow, thread the needle Rt side.    Other Shoulder Stretches thoracic mobilization with bolster      Manual Therapy   Manual therapy comments skilled palpation to assess response to dry needling; STM    Soft tissue mobilization STM for deltoids and pecs            Trigger Point Dry Needling - 04/04/20 0001    Consent Given? Yes    Education Handout Provided Previously provided    Muscles Treated Upper Quadrant Pectoralis minor;Pectoralis major;Deltoid    Electrical Stimulation Performed with Dry Needling Yes    Pectoralis Major Response Twitch response elicited;Palpable increased muscle length    Pectoralis Minor Response Palpable increased muscle length;Twitch response elicited    Deltoid Response Palpable increased muscle length;Twitch response elicited   with estim  PT Long Term Goals - 04/04/20 0836      PT LONG TERM GOAL #1   Title The patient will be indep with HEP for R shoulder flexibility and strengthening.    Status On-going      PT LONG TERM GOAL #2   Title The patient will reduce functional limitation per FOTO from 44% to < or equal to 26%.    Status On-going      PT LONG TERM GOAL #3   Title The patient will have pain free R shoulder flexion to 160 degrees.    Status On-going      PT LONG TERM GOAL #4   Title The patient will report no neck pain.    Baseline no pain - does have stiffness - its improved per patient    Status On-going                 Plan - 04/04/20 8315    Clinical Impression Statement Pt tolerated DN well, reports some relief in his neck from the last session.  Making porgress to his goals. [name] does have tightness throughout his whole back and  benefited from some lumbar stretching.  Pain in Rt shoulder is with abduction past 90 degrees.  His deltoid is over active pulling humerous up.  He is going to bring his computer mouse closer to his body to decrease deltoid activiation.    Rehab Potential Good    PT Frequency 2x / week    PT Duration 6 weeks    PT Treatment/Interventions ADLs/Self Care Home Management;Therapeutic activities;Therapeutic exercise;Patient/family education;Taping;Dry needling;Manual techniques;Moist Heat;Cryotherapy;Electrical Stimulation;Passive range of motion    PT Next Visit Plan continue postural strengthening, manual for myofascial tightness    PT Home Exercise Plan Access Code: [email or web address]    Consulted and Agree with Plan of Care Patient           Patient will benefit from skilled therapeutic intervention in order to improve the following deficits and impairments:  Pain,Increased fascial restricitons,Decreased range of motion,Decreased strength,Decreased activity tolerance,Postural dysfunction,Impaired flexibility,Hypomobility  Visit Diagnosis: Chronic right shoulder pain  Muscle weakness (generalized)  Abnormal posture     Problem List Patient Active Problem List   Diagnosis Date Noted  . Major depression in full remission ([place]) 02/12/2017  . Chronic cough 06/29/2014  . Hyperlipidemia 03/14/2014  . OSA (obstructive sleep apnea) 11/08/2011  . MRSA infection 03/29/2011  . GAD (generalized anxiety disorder) 02/13/2011  . Gastroesophageal reflux disease 02/13/2011  . Hypogonadism male 02/13/2011    Jeral Pinch PT  04/04/2020, 8:45 AM  [facility] [place], [place], [address] Phone: [phone]   Fax:  [phone]  Name: Thomas Duran MRN: [id number] Date of Birth: [birth year]

## 2020-04-06 ENCOUNTER — Ambulatory Visit (INDEPENDENT_AMBULATORY_CARE_PROVIDER_SITE_OTHER): Payer: BC Managed Care – PPO | Admitting: Physical Therapy

## 2020-04-06 ENCOUNTER — Other Ambulatory Visit: Payer: Self-pay

## 2020-04-06 ENCOUNTER — Encounter: Payer: Self-pay | Admitting: Physical Therapy

## 2020-04-06 DIAGNOSIS — R293 Abnormal posture: Secondary | ICD-10-CM

## 2020-04-06 DIAGNOSIS — G8929 Other chronic pain: Secondary | ICD-10-CM

## 2020-04-06 DIAGNOSIS — M6281 Muscle weakness (generalized): Secondary | ICD-10-CM

## 2020-04-06 DIAGNOSIS — M25511 Pain in right shoulder: Secondary | ICD-10-CM

## 2020-04-06 NOTE — Therapy (Signed)
[place], [place], [address] Phone: [phone]   Fax:  [phone]  Physical Therapy Treatment  Patient Details  Name: Thomas Duran MRN: [id number] Date of Birth: [birth year] Referring Provider (PT): Lynne Leader, MD   Encounter Date: 04/06/2020   PT End of Session - 04/06/20 0927    Visit Number 6    Number of Visits 12    Date for PT Re-Evaluation 05/01/20    Authorization Type BCBS    PT Start Time 0847    PT Stop Time 0936    PT Time Calculation (min) 49 min    Activity Tolerance Patient tolerated treatment well    Behavior During Therapy [facility] for tasks assessed/performed           Past Medical History:  Diagnosis Date  . Generalized anxiety disorder   . GERD (gastroesophageal reflux disease)   . Hypogonadism in male    in past - Managed by endo - Dr. Buddy Duty. self weaned from meds and no signifcant change in symptoms.     Past Surgical History:  Procedure Laterality Date  . WISDOM TOOTH EXTRACTION      There were no vitals filed for this visit.   Subjective Assessment - 04/06/20 0848    Subjective Thomas Duran said he had more relief with the DN again, having some tigthness returning now.    Patient Stated Goals Reduce shoulder pain    Currently in Pain? Yes   only with certain arm movements   Pain Score 2    7/10 with abdcution                            [facility] Adult PT Treatment/Exercise - 04/06/20 0001      Exercises   Exercises Shoulder      Shoulder Exercises: Seated   Other Seated Exercises 10 reps each 2#, full can lifts bilat, alt and single.      Shoulder Exercises: Prone   Retraction Strengthening;Both;15 reps;Weights   off corner of mat   Retraction Weight (lbs) 2    Retraction Limitations palms down, and thumbs up, T's and Y's - no wt with Y      Shoulder Exercises: Sidelying   External Rotation Strengthening;Right;10 reps;Weights   towel under  arm, 3 sets   External Rotation Weight (lbs) 3    Other Sidelying Exercises 3x10 empty can with 2#      Shoulder Exercises: Standing   Flexion Strengthening;Both;10 reps;Theraband   2 sets   Theraband Level (Shoulder Flexion) Level 2 (Red)   around wrists     Shoulder Exercises: ROM/Strengthening   UBE (Upper Arm Bike) L2x4' alt FWD/BWD      Modalities   Modalities Moist Heat      Moist Heat Therapy   Number Minutes Moist Heat 10 Minutes    Moist Heat Location Shoulder   posterior Rt shoulder     Manual Therapy   Manual therapy comments skilled palpation to assess response to dry needling; STM    Soft tissue mobilization STM to Rt upper trap/levator, deltoid and posterior shoulder muscles            Trigger Point Dry Needling - 04/06/20 0001    Consent Given? Yes    Education Handout Provided Previously provided    Muscles Treated Head and Neck Levator scapulae    Muscles Treated Upper Quadrant Deltoid;Teres minor;Teres major  Levator Scapulae Response Palpable increased muscle length    Deltoid Response Palpable increased muscle length;Twitch response elicited   posterior   Teres major Response Palpable increased muscle length;Twitch response elicited    Teres minor Response Palpable increased muscle length;Twitch response elicited                     PT Long Term Goals - 04/04/20 0836      PT LONG TERM GOAL #1   Title The patient will be indep with HEP for R shoulder flexibility and strengthening.    Status On-going      PT LONG TERM GOAL #2   Title The patient will reduce functional limitation per FOTO from 44% to < or equal to 26%.    Status On-going      PT LONG TERM GOAL #3   Title The patient will have pain free R shoulder flexion to 160 degrees.    Status On-going      PT LONG TERM GOAL #4   Title The patient will report no neck pain.    Baseline no pain - does have stiffness - its improved per patient    Status On-going                  Plan - 04/06/20 0928    Clinical Impression Statement [name] was very tight through the Rt teres minor/major with big releases with DN,  he was sore after , heat decreased this at end of session.  His scapula begins moving before humerous reaches 90 degree of elevation, decreasing tightness in muscles treated today should help that.  He will be out of town next week and return after this.    Rehab Potential Good    PT Frequency 2x / week    PT Duration 6 weeks    PT Treatment/Interventions ADLs/Self Care Home Management;Therapeutic activities;Therapeutic exercise;Patient/family education;Taping;Dry needling;Manual techniques;Moist Heat;Cryotherapy;Electrical Stimulation;Passive range of motion    PT Next Visit Plan continue postural strengthening, manual for myofascial tightness    PT Home Exercise Plan Access Code: [email or web address]    Consulted and Agree with Plan of Care Patient           Patient will benefit from skilled therapeutic intervention in order to improve the following deficits and impairments:  Pain,Increased fascial restricitons,Decreased range of motion,Decreased strength,Decreased activity tolerance,Postural dysfunction,Impaired flexibility,Hypomobility  Visit Diagnosis: Chronic right shoulder pain  Muscle weakness (generalized)  Abnormal posture     Problem List Patient Active Problem List   Diagnosis Date Noted  . Major depression in full remission ([place]) 02/12/2017  . Chronic cough 06/29/2014  . Hyperlipidemia 03/14/2014  . OSA (obstructive sleep apnea) 11/08/2011  . MRSA infection 03/29/2011  . GAD (generalized anxiety disorder) 02/13/2011  . Gastroesophageal reflux disease 02/13/2011  . Hypogonadism male 02/13/2011    Jeral Pinch PT 04/06/2020, 9:30 AM  [facility] [facility] [place], [place], [address] Phone: [phone]   Fax:  [phone]  Name: Thomas Duran MRN:  [id number] Date of Birth: 1975/07/14

## 2020-04-13 ENCOUNTER — Encounter: Payer: BC Managed Care – PPO | Admitting: Physical Therapy

## 2020-04-18 ENCOUNTER — Ambulatory Visit: Payer: BC Managed Care – PPO | Admitting: Physical Therapy

## 2020-04-18 ENCOUNTER — Encounter: Payer: Self-pay | Admitting: Physical Therapy

## 2020-04-18 ENCOUNTER — Other Ambulatory Visit: Payer: Self-pay

## 2020-04-18 DIAGNOSIS — R293 Abnormal posture: Secondary | ICD-10-CM

## 2020-04-18 DIAGNOSIS — G8929 Other chronic pain: Secondary | ICD-10-CM | POA: Diagnosis not present

## 2020-04-18 DIAGNOSIS — M25511 Pain in right shoulder: Secondary | ICD-10-CM | POA: Diagnosis not present

## 2020-04-18 DIAGNOSIS — M6281 Muscle weakness (generalized): Secondary | ICD-10-CM | POA: Diagnosis not present

## 2020-04-18 NOTE — Patient Instructions (Signed)
Access Code: [email or web address] URL: [email or web address] Date: 04/18/2020 Prepared by: Roderic Scarce  Exercises Doorway Pec Stretch at 90 Degrees Abduction - 7 x weekly - 1 sets - 2-3 reps - 15-30 seconds hold Seated Thoracic Lumbar Extension with Pectoralis Stretch - 1 x daily - 7 x weekly - 1 sets - 2-3 reps - 5 seconds hold Standing Shoulder External Rotation with Resistance - 7 x weekly - 3 sets - 10 reps Shoulder Flexion Serratus Activation with Resistance - 1 x daily - 2-3 sets - 10 reps Prone Scapular Retraction Arms at Side - 1 x daily - 3 sets - 10 reps Prone Shoulder Horizontal Abduction with Thumbs Up - 1 x daily - 3 sets - 10 reps

## 2020-04-18 NOTE — Therapy (Signed)
[place], [place], [address] Phone: [phone]   Fax:  [phone]  Physical Therapy Treatment  Patient Details  Name: Thomas Duran MRN: [id number] Date of Birth: 11-Oct-1975 Referring Provider (PT): Lynne Leader, MD   Encounter Date: 04/18/2020   PT End of Session - 04/18/20 0758    Visit Number 7    Number of Visits 12    Date for PT Re-Evaluation 05/01/20    Authorization Type BCBS    PT Start Time 0758    PT Stop Time 0850    PT Time Calculation (min) 52 min    Activity Tolerance Patient tolerated treatment well    Behavior During Therapy [facility] for tasks assessed/performed           Past Medical History:  Diagnosis Date  . Generalized anxiety disorder   . GERD (gastroesophageal reflux disease)   . Hypogonadism in male    in past - Managed by endo - Dr. Buddy Duty. self weaned from meds and no signifcant change in symptoms.     Past Surgical History:  Procedure Laterality Date  . WISDOM TOOTH EXTRACTION      There were no vitals filed for this visit.   Subjective Assessment - 04/18/20 0759    Subjective [name] reports about 30-40% improvement.  pain still in shoulder with elevation    Currently in Pain? No/denies              [facility] PT Assessment - 04/18/20 0001      Assessment   Medical Diagnosis Chronic R shoulder pain    Referring Provider (PT) Lynne Leader, MD    Next MD Visit PRN      Strength   Right Shoulder External Rotation 4+/5                         [facility] Adult PT Treatment/Exercise - 04/18/20 0001      Exercises   Exercises Shoulder      Shoulder Exercises: Seated   Other Seated Exercises 10 reps each 2#, full can lifts bilat, alt and single.      Shoulder Exercises: Prone   Retraction Strengthening;Both    Retraction Limitations 3x10 palms down and thumbs up      Shoulder Exercises: Standing   Flexion Strengthening;Both;20 reps;Theraband     Theraband Level (Shoulder Flexion) Level 2 (Red)    Flexion Limitations serratus raises to 90 degrees, red band around wrists      Shoulder Exercises: ROM/Strengthening   UBE (Upper Arm Bike) L3x4' alt FWD/BWD      Modalities   Modalities Moist Heat      Moist Heat Therapy   Number Minutes Moist Heat 10 Minutes    Moist Heat Location Shoulder      Manual Therapy   Manual Therapy --   rt   Manual therapy comments skilled palpation to assess response to dry needling; STM    Soft tissue mobilization STM to Rt shoulder, deltoid, teres minor/major, lats and upper trap/scalenes            Trigger Point Dry Needling - 04/18/20 0001    Consent Given? Yes    Education Handout Provided Previously provided    Muscles Treated Head and Neck Upper trapezius    Muscles Treated Upper Quadrant Deltoid    Upper Trapezius Response Palpable increased muscle length;Twitch reponse elicited   Rt with stim   Deltoid Response Palpable  increased muscle length;Twitch response elicited   Rt with stim               PT Education - 04/18/20 0818    Education Details HEP progression    Person(s) Educated Patient    Methods Explanation;Demonstration;Handout    Comprehension Returned demonstration;Verbalized understanding               PT Long Term Goals - 04/04/20 0836      PT LONG TERM GOAL #1   Title The patient will be indep with HEP for R shoulder flexibility and strengthening.    Status On-going      PT LONG TERM GOAL #2   Title The patient will reduce functional limitation per FOTO from 44% to < or equal to 26%.    Status On-going      PT LONG TERM GOAL #3   Title The patient will have pain free R shoulder flexion to 160 degrees.    Status On-going      PT LONG TERM GOAL #4   Title The patient will report no neck pain.    Baseline no pain - does have stiffness - its improved per patient    Status On-going                 Plan - 04/18/20 0809    Clinical  Impression Statement Thomas Duran reports 30-40% improvement in his shoulder symptoms. No pain at rest, only with elevation.  Had good releases with DN, alot of tightness in the Rt deltoid and upper trap.  Will work on inhibiting deltoid to decrease compression in the shoulder with elevatoin    Rehab Potential Good    PT Frequency 2x / week    PT Duration 6 weeks    PT Treatment/Interventions ADLs/Self Care Home Management;Therapeutic activities;Therapeutic exercise;Patient/family education;Taping;Dry needling;Manual techniques;Moist Heat;Cryotherapy;Electrical Stimulation;Passive range of motion    PT Next Visit Plan continue postural strengthening, manual for myofascial tightness    PT Home Exercise Plan Access Code: [email or web address]    Consulted and Agree with Plan of Care Patient           Patient will benefit from skilled therapeutic intervention in order to improve the following deficits and impairments:  Pain,Increased fascial restricitons,Decreased range of motion,Decreased strength,Decreased activity tolerance,Postural dysfunction,Impaired flexibility,Hypomobility  Visit Diagnosis: Chronic right shoulder pain  Muscle weakness (generalized)  Abnormal posture     Problem List Patient Active Problem List   Diagnosis Date Noted  . Major depression in full remission (HCC) 02/12/2017  . Chronic cough 06/29/2014  . Hyperlipidemia 03/14/2014  . OSA (obstructive sleep apnea) 11/08/2011  . MRSA infection 03/29/2011  . GAD (generalized anxiety disorder) 02/13/2011  . Gastroesophageal reflux disease 02/13/2011  . Hypogonadism male 02/13/2011    Roderic Scarce PT  04/18/2020, 8:44 AM  [facility] [address] [place], [place], [address] Phone: [phone]   Fax:  [phone]  Name: Thomas Duran MRN: [id number] Date of Birth: Aug 25, 1975

## 2020-04-21 ENCOUNTER — Ambulatory Visit: Payer: BC Managed Care – PPO | Admitting: Physical Therapy

## 2020-04-21 ENCOUNTER — Other Ambulatory Visit: Payer: Self-pay

## 2020-04-21 DIAGNOSIS — M25511 Pain in right shoulder: Secondary | ICD-10-CM | POA: Diagnosis not present

## 2020-04-21 DIAGNOSIS — G8929 Other chronic pain: Secondary | ICD-10-CM | POA: Diagnosis not present

## 2020-04-21 DIAGNOSIS — R293 Abnormal posture: Secondary | ICD-10-CM

## 2020-04-21 DIAGNOSIS — M6281 Muscle weakness (generalized): Secondary | ICD-10-CM | POA: Diagnosis not present

## 2020-04-21 NOTE — Therapy (Signed)
[place], [place], [address] Phone: [phone]   Fax:  [phone]  Physical Therapy Treatment  Patient Details  Name: Thomas Duran MRN: [id number] Date of Birth: [birth year] Referring Provider (PT): Lynne Leader, MD   Encounter Date: 04/21/2020   PT End of Session - 04/21/20 0845    Visit Number 8    Number of Visits 12    Date for PT Re-Evaluation 05/01/20    Authorization Type BCBS    PT Start Time 0800    PT Stop Time 0842    PT Time Calculation (min) 42 min    Activity Tolerance Patient tolerated treatment well    Behavior During Therapy [facility] for tasks assessed/performed           Past Medical History:  Diagnosis Date  . Generalized anxiety disorder   . GERD (gastroesophageal reflux disease)   . Hypogonadism in male    in past - Managed by endo - Dr. Buddy Duty. self weaned from meds and no signifcant change in symptoms.     Past Surgical History:  Procedure Laterality Date  . WISDOM TOOTH EXTRACTION      There were no vitals filed for this visit.   Subjective Assessment - 04/21/20 0802    Subjective pt reports his shoulder is "50% better". Pt states he still feels clicking sometimes but that overall it is much better    Patient Stated Goals Reduce shoulder pain    Currently in Pain? No/denies                             [facility] Adult PT Treatment/Exercise - 04/21/20 0001      Shoulder Exercises: Seated   Other Seated Exercises 10 reps each: 2# bilat, alt with opp side holding static      Shoulder Exercises: Prone   Retraction Strengthening;Both    Retraction Limitations 2 x 10 each: palms down and then thumbs up    Other Prone Exercises Y, I 2 x 10 each    Other Prone Exercises push up plus x 10 with UEs on mat table      Shoulder Exercises: Standing   Other Standing Exercises serratus wall slides and wall clocks x 10 each with red TB    Other Standing  Exercises ball bounce on wall at 90 flexion and at 3 o'clock x 30 sec each      Shoulder Exercises: ROM/Strengthening   UBE (Upper Arm Bike) L3 x 4 min alt fwd/bkwd      Manual Therapy   Manual therapy comments skilled palpation to assess response to dry needling    Soft tissue mobilization STM Rt UT, deltoids, lats            Trigger Point Dry Needling - 04/21/20 0001    Consent Given? Yes    Education Handout Provided Previously provided    Muscles Treated Head and Neck Upper trapezius    Muscles Treated Upper Quadrant Deltoid    Upper Trapezius Response Palpable increased muscle length    Deltoid Response Palpable increased muscle length                     PT Long Term Goals - 04/04/20 0836      PT LONG TERM GOAL #1   Title The patient will be indep with HEP for R shoulder flexibility and strengthening.    Status On-going  PT LONG TERM GOAL #2   Title The patient will reduce functional limitation per FOTO from 44% to < or equal to 26%.    Status On-going      PT LONG TERM GOAL #3   Title The patient will have pain free R shoulder flexion to 160 degrees.    Status On-going      PT LONG TERM GOAL #4   Title The patient will report no neck pain.    Baseline no pain - does have stiffness - its improved per patient    Status On-going                 Plan - 04/21/20 0846    Clinical Impression Statement Pt with good scapular mm activation during prone exercises. fatigues with push up plus, cues for form with serratus exericses    PT Treatment/Interventions ADLs/Self Care Home Management;Therapeutic activities;Therapeutic exercise;Patient/family education;Taping;Dry needling;Manual techniques;Moist Heat;Cryotherapy;Electrical Stimulation;Passive range of motion    PT Next Visit Plan theraband for throwing and basketball shooting exercises    PT Home Exercise Plan Access Code: [email or web address]    Consulted and Agree with Plan of Care Patient            Patient will benefit from skilled therapeutic intervention in order to improve the following deficits and impairments:     Visit Diagnosis: Chronic right shoulder pain  Muscle weakness (generalized)  Abnormal posture     Problem List Patient Active Problem List   Diagnosis Date Noted  . Major depression in full remission ([place]) 02/12/2017  . Chronic cough 06/29/2014  . Hyperlipidemia 03/14/2014  . OSA (obstructive sleep apnea) 11/08/2011  . MRSA infection 03/29/2011  . GAD (generalized anxiety disorder) 02/13/2011  . Gastroesophageal reflux disease 02/13/2011  . Hypogonadism male 02/13/2011   Isabelle Course, PT  [facility] 04/21/2020, 8:49 AM  [facility] [place], [place], [address] Phone: [phone]   Fax:  [phone]  Name: Thomas Duran MRN: [id number] Date of Birth: June 30, 1975

## 2020-04-25 ENCOUNTER — Other Ambulatory Visit: Payer: Self-pay

## 2020-04-25 ENCOUNTER — Ambulatory Visit: Payer: BC Managed Care – PPO | Admitting: Physical Therapy

## 2020-04-25 ENCOUNTER — Encounter: Payer: Self-pay | Admitting: Physical Therapy

## 2020-04-25 DIAGNOSIS — R293 Abnormal posture: Secondary | ICD-10-CM | POA: Diagnosis not present

## 2020-04-25 DIAGNOSIS — G8929 Other chronic pain: Secondary | ICD-10-CM

## 2020-04-25 DIAGNOSIS — M25511 Pain in right shoulder: Secondary | ICD-10-CM | POA: Diagnosis not present

## 2020-04-25 DIAGNOSIS — M6281 Muscle weakness (generalized): Secondary | ICD-10-CM

## 2020-04-25 NOTE — Patient Instructions (Signed)
Access Code: [email or web address] URL: [email or web address] Date: 04/25/2020 Prepared by: Isabelle Course  Exercises Doorway Pec Stretch at 90 Degrees Abduction - 7 x weekly - 1 sets - 2-3 reps - 15-30 seconds hold Seated Thoracic Lumbar Extension with Pectoralis Stretch - 1 x daily - 7 x weekly - 1 sets - 2-3 reps - 5 seconds hold Standing Shoulder External Rotation with Resistance - 7 x weekly - 3 sets - 10 reps Prone Shoulder Horizontal Abduction with Thumbs Up - 1 x daily - 3 sets - 10 reps Serratus Activation at Wall with Foam Roll and Resistance Band - 1 x daily - 7 x weekly - 3 sets - 10 reps Standing Diagonal Chop - 1 x daily - 7 x weekly - 3 sets - 10 reps Standing Diagonal Lift with Anchored Resistance - 1 x daily - 7 x weekly - 3 sets - 10 reps

## 2020-04-25 NOTE — Therapy (Signed)
[place], [place], [address] Phone: [phone]   Fax:  [phone]  Physical Therapy Treatment  Patient Details  Name: Thomas Duran MRN: [id number] Date of Birth: Jul 10, 1975 Referring Provider (PT): Lynne Leader, MD   Encounter Date: 04/25/2020   PT End of Session - 04/25/20 0849    Visit Number 9    Number of Visits 12    Date for PT Re-Evaluation 05/01/20    PT Start Time 0800    PT Stop Time 0844    PT Time Calculation (min) 44 min    Activity Tolerance Patient tolerated treatment well    Behavior During Therapy [facility] for tasks assessed/performed           Past Medical History:  Diagnosis Date  . Generalized anxiety disorder   . GERD (gastroesophageal reflux disease)   . Hypogonadism in male    in past - Managed by endo - Dr. Buddy Duty. self weaned from meds and no signifcant change in symptoms.     Past Surgical History:  Procedure Laterality Date  . WISDOM TOOTH EXTRACTION      There were no vitals filed for this visit.   Subjective Assessment - 04/25/20 0804    Subjective Pt reports that he feels almost all better, still some pain/soreness in 1 spot in his shoulder    Patient Stated Goals Reduce shoulder pain    Currently in Pain? No/denies                             [facility] Adult PT Treatment/Exercise - 04/25/20 0001      Shoulder Exercises: Prone   Other Prone Exercises push up plus x 10 to mat table      Shoulder Exercises: Standing   Flexion Limitations scaption with Rt UE static hold while lifting x 10 with Lt UE, then alternating. 2 sets of 10 4#    Diagonals Strengthening;Both;20 reps   both directions   Theraband Level (Shoulder Diagonals) Level 3 (Green)    Other Standing Exercises throwing motion green TB 2 x 10    Other Standing Exercises rebounder ball toss 1 UE and then overhead 2 x 10 each      Shoulder Exercises: ROM/Strengthening   UBE  (Upper Arm Bike) L3 x 4 min alternating directions      Manual Therapy   Soft tissue mobilization STM Rt UT, deltoids, lats                  PT Education - 04/25/20 0849    Education Details updated HEP    Person(s) Educated Patient    Methods Explanation;Demonstration    Comprehension Verbalized understanding;Returned demonstration               PT Long Term Goals - 04/04/20 0836      PT LONG TERM GOAL #1   Title The patient will be indep with HEP for R shoulder flexibility and strengthening.    Status On-going      PT LONG TERM GOAL #2   Title The patient will reduce functional limitation per FOTO from 44% to < or equal to 26%.    Status On-going      PT LONG TERM GOAL #3   Title The patient will have pain free R shoulder flexion to 160 degrees.    Status On-going      PT LONG TERM GOAL #4  Title The patient will report no neck pain.    Baseline no pain - does have stiffness - its improved per patient    Status On-going                 Plan - 04/25/20 0849    Clinical Impression Statement Pt with good tolerance to addition of throwing and overhead activities. Discussed the importance of end range stretching to reduce pain and "catching" with ER and flexion    PT Treatment/Interventions ADLs/Self Care Home Management;Therapeutic activities;Therapeutic exercise;Patient/family education;Taping;Dry needling;Manual techniques;Moist Heat;Cryotherapy;Electrical Stimulation;Passive range of motion    PT Next Visit Plan assess addition of throwing exercises, update HEP    PT Home Exercise Plan Access Code: [email or web address]    Consulted and Agree with Plan of Care Patient           Patient will benefit from skilled therapeutic intervention in order to improve the following deficits and impairments:     Visit Diagnosis: Chronic right shoulder pain  Muscle weakness (generalized)  Abnormal posture     Problem List Patient Active Problem List   Diagnosis  Date Noted  . Major depression in full remission ([place]) 02/12/2017  . Chronic cough 06/29/2014  . Hyperlipidemia 03/14/2014  . OSA (obstructive sleep apnea) 11/08/2011  . MRSA infection 03/29/2011  . GAD (generalized anxiety disorder) 02/13/2011  . Gastroesophageal reflux disease 02/13/2011  . Hypogonadism male 02/13/2011   Isabelle Course, PT  [facility] 04/25/2020, 8:51 AM  [facility] [place], [place], [address] Phone: [phone]   Fax:  [phone]  Name: Thomas Duran MRN: [id number] Date of Birth: 12/09/1975

## 2020-04-28 ENCOUNTER — Ambulatory Visit: Payer: BC Managed Care – PPO | Admitting: Physical Therapy

## 2020-04-28 ENCOUNTER — Encounter: Payer: Self-pay | Admitting: Physical Therapy

## 2020-04-28 ENCOUNTER — Other Ambulatory Visit: Payer: Self-pay

## 2020-04-28 DIAGNOSIS — M6281 Muscle weakness (generalized): Secondary | ICD-10-CM

## 2020-04-28 DIAGNOSIS — G8929 Other chronic pain: Secondary | ICD-10-CM | POA: Diagnosis not present

## 2020-04-28 DIAGNOSIS — R293 Abnormal posture: Secondary | ICD-10-CM | POA: Diagnosis not present

## 2020-04-28 DIAGNOSIS — M25511 Pain in right shoulder: Secondary | ICD-10-CM

## 2020-04-28 NOTE — Therapy (Signed)
[place], [place], [address] Phone: [phone]   Fax:  [phone]  Physical Therapy Re-Evaluation and Treatment  Patient Details  Name: Thomas Duran MRN: [id number] Date of Birth: February 17, 1976 Referring Baruc Tug[place] (PT): Lynne Leader, MD   Encounter Date: 04/28/2020   PT End of Session - 04/28/20 0852    Visit Number 10    Number of Visits 12    Date for PT Re-Evaluation 05/26/20    PT Start Time 0800    PT Stop Time 0845    PT Time Calculation (min) 45 min    Activity Tolerance Patient tolerated treatment well    Behavior During Therapy [facility] for tasks assessed/performed           Past Medical History:  Diagnosis Date  . Generalized anxiety disorder   . GERD (gastroesophageal reflux disease)   . Hypogonadism in male    in past - Managed by endo - Dr. Buddy Duty. self weaned from meds and no signifcant change in symptoms.     Past Surgical History:  Procedure Laterality Date  . WISDOM TOOTH EXTRACTION      There were no vitals filed for this visit.   Subjective Assessment - 04/28/20 0805    Subjective Pt states that his shoulder still feels "a little stiff"    Patient Stated Goals Reduce shoulder pain    Currently in Pain? No/denies              [facility] PT Assessment - 04/28/20 0001      Observation/Other Assessments   Focus on Therapeutic Outcomes (FOTO)  73 functional status measure      AROM   Right Shoulder Flexion 150 Degrees                         [facility] Adult PT Treatment/Exercise - 04/28/20 0001      Shoulder Exercises: Prone   Other Prone Exercises push up plus on wall 2 x 10 with cues for form      Shoulder Exercises: Standing   Flexion Limitations scaption Rt UE static hold while Lt UE 2 x 10, then switch. 4#    Diagonals Strengthening;Both;20 reps    Theraband Level (Shoulder Diagonals) Level 3 (Green)    Other Standing Exercises throwing motion  green TB 2 x 10, serratus wall slides 2 x 10 red TB    Other Standing Exercises rebounder ball throw baseball throw and overhead throw      Shoulder Exercises: ROM/Strengthening   UBE (Upper Arm Bike) L3 x 4 min alt fwd/bkwd      Manual Therapy   Manual Therapy Joint mobilization    Joint Mobilization grade 2-3 mobs to Rt shoulder distraction and inferior glides    Soft tissue mobilization STM deltoids and Rt UT                       PT Long Term Goals - 04/28/20 0825      PT LONG TERM GOAL #1   Title The patient will be indep with HEP for R shoulder flexibility and strengthening.    Status On-going    Target Date 05/26/20      PT LONG TERM GOAL #2   Title The patient will reduce functional limitation per FOTO from 44% to < or equal to 26%.    Baseline functional status measure 73% 04/28/20    Status On-going  Target Date 05/26/20      PT LONG TERM GOAL #3   Title The patient will have pain free R shoulder flexion to 160 degrees.    Baseline 150 degrees 04/28/20    Status On-going    Target Date 05/26/20      PT LONG TERM GOAL #4   Title The patient will report no neck pain.    Status Achieved      PT LONG TERM GOAL #5   Title The patient will verbalize understanding of gym activities for post d/c program.    Status Achieved                 Plan - 04/28/20 3361    Clinical Impression Statement Re-eval completed this visit. Pt has improved ROM and FOTO score, has met 2/5 long term goals. Pt will benefit from continued PT treatment to work towards achieving remaining goals    Examination-Activity Limitations Lift;Reach Overhead    Examination-Participation Restrictions Community Activity    PT Frequency 2x / week    PT Duration 4 weeks    PT Treatment/Interventions ADLs/Self Care Home Management;Therapeutic activities;Therapeutic exercise;Patient/family education;Taping;Dry needling;Manual techniques;Moist Heat;Cryotherapy;Electrical  Stimulation;Passive range of motion    PT Next Visit Plan manual for end range extension, flexion. progress wt bearing exercises (plank/push up)    PT Home Exercise Plan Access Code: [email or web address]    Consulted and Agree with Plan of Care Patient           Patient will benefit from skilled therapeutic intervention in order to improve the following deficits and impairments:  Pain,Increased fascial restricitons,Decreased range of motion,Decreased strength,Decreased activity tolerance,Postural dysfunction,Impaired flexibility,Hypomobility  Visit Diagnosis: Chronic right shoulder pain - Plan: PT plan of care cert/re-cert  Muscle weakness (generalized) - Plan: PT plan of care cert/re-cert  Abnormal posture - Plan: PT plan of care cert/re-cert     Problem List Patient Active Problem List   Diagnosis Date Noted  . Major depression in full remission ([place]) 02/12/2017  . Chronic cough 06/29/2014  . Hyperlipidemia 03/14/2014  . OSA (obstructive sleep apnea) 11/08/2011  . MRSA infection 03/29/2011  . GAD (generalized anxiety disorder) 02/13/2011  . Gastroesophageal reflux disease 02/13/2011  . Hypogonadism male 02/13/2011   Isabelle Course, PT  [facility] 04/28/2020, 8:56 AM  [facility] [place], [place], [address] Phone: [phone]   Fax:  [phone]  Name: Thomas Duran MRN: [id number] Date of Birth: [birth year]

## 2020-05-04 ENCOUNTER — Encounter: Payer: BC Managed Care – PPO | Admitting: Physical Therapy

## 2020-05-04 ENCOUNTER — Other Ambulatory Visit: Payer: Self-pay

## 2020-05-05 ENCOUNTER — Encounter: Payer: Self-pay | Admitting: Physical Therapy

## 2020-05-05 ENCOUNTER — Ambulatory Visit (INDEPENDENT_AMBULATORY_CARE_PROVIDER_SITE_OTHER): Payer: BC Managed Care – PPO | Admitting: Physical Therapy

## 2020-05-05 DIAGNOSIS — M6281 Muscle weakness (generalized): Secondary | ICD-10-CM

## 2020-05-05 DIAGNOSIS — M25511 Pain in right shoulder: Secondary | ICD-10-CM

## 2020-05-05 DIAGNOSIS — G8929 Other chronic pain: Secondary | ICD-10-CM

## 2020-05-05 DIAGNOSIS — R293 Abnormal posture: Secondary | ICD-10-CM | POA: Diagnosis not present

## 2020-05-05 NOTE — Therapy (Addendum)
[place], [place], [address] Phone: [phone]   Fax:  [phone]  Physical Therapy Treatment and Discharge  Patient Details  Name: Thomas Duran MRN: [id number] Date of Birth: January 26, 1976 Referring Provider (PT): Lynne Leader, MD   Encounter Date: 05/05/2020   PT End of Session - 05/05/20 0810    Visit Number 11    Number of Visits 20    Date for PT Re-Evaluation 05/26/20    Authorization Type BCBS    PT Start Time 0804    PT Stop Time 0850    PT Time Calculation (min) 46 min    Activity Tolerance Patient tolerated treatment well    Behavior During Therapy [facility] for tasks assessed/performed           Past Medical History:  Diagnosis Date  . Generalized anxiety disorder   . GERD (gastroesophageal reflux disease)   . Hypogonadism in male    in past - Managed by endo - Dr. Buddy Duty. self weaned from meds and no signifcant change in symptoms.     Past Surgical History:  Procedure Laterality Date  . WISDOM TOOTH EXTRACTION      There were no vitals filed for this visit.   Subjective Assessment - 05/05/20 0919    Subjective Pt reports he has 70% improvement in symptoms since starting therapy.  He still has pain when he horiz abdcts and IR Rt arm, up to 7-8/10; pain immediately resolves once he returns his arm to neutral.    Currently in Pain? No/denies    Pain Score 0-No pain              [facility] PT Assessment - 05/05/20 0001      Assessment   Medical Diagnosis Chronic R shoulder pain    Referring Provider (PT) Lynne Leader, MD    Onset Date/Surgical Date --   11/2019   Hand Dominance Right    Next MD Visit PRN            [facility] Adult PT Treatment/Exercise - 05/05/20 0001      Shoulder Exercises: Supine   Diagonals Right;10 reps    Theraband Level (Shoulder Diagonals) Level 3 (Green)    Other Supine Exercises snow angels x 5 reps (2 sets, 1 before/ 1 after IASTM).  arms in T/Y  with lower trunk rotation to Lt x 15 sec x 4 reps (2 before/2 after IASTM)      Shoulder Exercises: Seated   Horizontal ABduction Strengthening;Both;10 reps    Theraband Level (Shoulder Horizontal ABduction) Level 3 (Green)    External Rotation Both;10 reps;Strengthening    Theraband Level (Shoulder External Rotation) Level 3 (Green)    Diagonals Right;10 reps    Theraband Level (Shoulder Diagonals) Level 2 (Red)      Shoulder Exercises: ROM/Strengthening   UBE (Upper Arm Bike) L3: 1 min forward/ 1 min backward      Shoulder Exercises: Stretch   Other Shoulder Stretches 3 position doorway stretch x 20 sec, Rt high doorway stretch x 2 reps, 15 sec (limited tolerance), bilat overhead stretch - limited tolerance.      Manual Therapy   Manual Therapy Soft tissue mobilization;Taping    Soft tissue mobilization IASTM to Rt pec, infraspinatus, teres major/minor, deltoid, bilat levator and Rt upper trap.    [place] I strip of reg [place] tape applied to Rt infraspinatus, with perpendicular  strip applied to area of infraspinatus insertion (area of complaint of pain)                  PT Education - 05/05/20 0918    Education Details HEP - added stretches    Person(s) Educated Patient    Methods Explanation;Demonstration;Tactile cues;Verbal cues    Comprehension Verbalized understanding;Returned demonstration               PT Long Term Goals - 04/28/20 0825      PT LONG TERM GOAL #1   Title The patient will be indep with HEP for R shoulder flexibility and strengthening.    Status On-going    Target Date 05/26/20      PT LONG TERM GOAL #2   Title The patient will reduce functional limitation per FOTO from 44% to < or equal to 26%.    Baseline functional status measure 73% 04/28/20    Status On-going    Target Date 05/26/20      PT LONG TERM GOAL #3   Title The patient will have pain free R shoulder flexion to 160 degrees.     Baseline 150 degrees 04/28/20    Status On-going    Target Date 05/26/20      PT LONG TERM GOAL #4   Title The patient will report no neck pain.    Status Achieved      PT LONG TERM GOAL #5   Title The patient will verbalize understanding of gym activities for post d/c program.    Status Achieved                 Plan - 05/05/20 0921    Clinical Impression Statement Pt continues with limited Rt shoulder ROM in end range abdct and horiz abdct. Pt complains of pain at the common insertion of teres minor and infraspinatus when he attempts to stretch in the high doorway pec stretch.  Palpable tightness in Rt teres major/lat, pec major/minor; may benefit from DN/ manual therapy to these areas as well as subscapularis.  Making good progress towards remaining goals.    Examination-Activity Limitations Lift;Reach Overhead    Examination-Participation Restrictions Community Activity    PT Frequency 2x / week    PT Duration 4 weeks    PT Treatment/Interventions ADLs/Self Care Home Management;Therapeutic activities;Therapeutic exercise;Patient/family education;Taping;Dry needling;Manual techniques;Moist Heat;Cryotherapy;Electrical Stimulation;Passive range of motion    PT Next Visit Plan manual therapy/ DN to Rt shoulder in areas of tightness, followed by stretches.  assess response to tape.    PT Home Exercise Plan Access Code: [email or web address]    Consulted and Agree with Plan of Care Patient           Patient will benefit from skilled therapeutic intervention in order to improve the following deficits and impairments:  Pain,Increased fascial restricitons,Decreased range of motion,Decreased strength,Decreased activity tolerance,Postural dysfunction,Impaired flexibility,Hypomobility  Visit Diagnosis: Chronic right shoulder pain  Muscle weakness (generalized)  Abnormal posture     Problem List Patient Active Problem List   Diagnosis Date Noted  . Major depression in full remission ([place])  02/12/2017  . Chronic cough 06/29/2014  . Hyperlipidemia 03/14/2014  . OSA (obstructive sleep apnea) 11/08/2011  . MRSA infection 03/29/2011  . GAD (generalized anxiety disorder) 02/13/2011  . Gastroesophageal reflux disease 02/13/2011  . Hypogonadism male 02/13/2011   PHYSICAL THERAPY DISCHARGE SUMMARY  Visits from Start of Care: 11  Current functional level related to goals / functional outcomes: Pt with improved strength and  ROM   Remaining deficits: See above   Education / Equipment: HEP Plan: Patient agrees to discharge.  Patient goals were partially met. Patient is being discharged due to being pleased with the current functional level.  ?????    Isabelle Course, PT,DPT02/18/221:30 PM  Kerin Perna, PTA 05/05/20 9:25 AM  [place], [place], [address] Phone: [phone]   Fax:  [phone]  Name: TAAJ HURLBUT MRN: [id number] Date of Birth: 1975-05-07

## 2020-05-05 NOTE — Patient Instructions (Signed)
Access Code: [email or web address] URL: [email or web address] Date: 05/05/2020 Prepared by: [place]  Exercises Doorway Pec Stretch at 90 Degrees Abduction - 7 x weekly - 1 sets - 2-3 reps - 15-30 seconds hold Seated Thoracic Lumbar Extension with Pectoralis Stretch - 1 x daily - 7 x weekly - 1 sets - 2-3 reps - 5 seconds hold Standing Shoulder External Rotation with Resistance - 7 x weekly - 3 sets - 10 reps Prone Shoulder Horizontal Abduction with Thumbs Up - 1 x daily - 3 sets - 10 reps Serratus Activation at Wall with Foam Roll and Resistance Band - 1 x daily - 7 x weekly - 3 sets - 10 reps Standing Diagonal Chop - 1 x daily - 3 x weekly - 3 sets - 10 reps Standing Diagonal Lift with Anchored Resistance - 1 x daily - 3 x weekly - 3 sets - 10 reps  Supine PNF D2 Flexion with Resistance - 1 x daily - 3 x weekly - 1-2 sets - 10 reps [place] - 1 x daily - 7 x weekly - 5 reps Supine Lower Trunk Rotation - 1 x daily - 7 x weekly - 1 sets - 2 reps - 15-30 seconds hold

## 2020-05-11 ENCOUNTER — Encounter: Payer: BC Managed Care – PPO | Admitting: Physical Therapy

## 2020-05-25 DIAGNOSIS — M9902 Segmental and somatic dysfunction of thoracic region: Secondary | ICD-10-CM | POA: Diagnosis not present

## 2020-05-25 DIAGNOSIS — M9901 Segmental and somatic dysfunction of cervical region: Secondary | ICD-10-CM | POA: Diagnosis not present

## 2020-05-25 DIAGNOSIS — M546 Pain in thoracic spine: Secondary | ICD-10-CM | POA: Diagnosis not present

## 2020-05-25 DIAGNOSIS — M542 Cervicalgia: Secondary | ICD-10-CM | POA: Diagnosis not present

## 2020-05-29 DIAGNOSIS — M9902 Segmental and somatic dysfunction of thoracic region: Secondary | ICD-10-CM | POA: Diagnosis not present

## 2020-05-29 DIAGNOSIS — M9901 Segmental and somatic dysfunction of cervical region: Secondary | ICD-10-CM | POA: Diagnosis not present

## 2020-05-29 DIAGNOSIS — M542 Cervicalgia: Secondary | ICD-10-CM | POA: Diagnosis not present

## 2020-05-29 DIAGNOSIS — M546 Pain in thoracic spine: Secondary | ICD-10-CM | POA: Diagnosis not present

## 2020-06-01 DIAGNOSIS — M9901 Segmental and somatic dysfunction of cervical region: Secondary | ICD-10-CM | POA: Diagnosis not present

## 2020-06-01 DIAGNOSIS — M542 Cervicalgia: Secondary | ICD-10-CM | POA: Diagnosis not present

## 2020-06-01 DIAGNOSIS — M9902 Segmental and somatic dysfunction of thoracic region: Secondary | ICD-10-CM | POA: Diagnosis not present

## 2020-06-01 DIAGNOSIS — M546 Pain in thoracic spine: Secondary | ICD-10-CM | POA: Diagnosis not present

## 2020-06-17 ENCOUNTER — Other Ambulatory Visit: Payer: Self-pay | Admitting: Family Medicine

## 2020-07-20 NOTE — Patient Instructions (Addendum)
Please stop by lab before you go If you have mychart- we will send your results within 3 business days of [place] receiving them.  If you do not have mychart- we will call you about results within 5 business days of [place] receiving them.  *please also note that you will see labs on mychart as soon as they post. I will later go in and write notes on them- will say "notes from Dr. Yong Channel"  With weight loss might be reasonable to pick up pepcid/famotidine over the counter and see if that is effective for reflux- if not can try again later if have more weight loss  Recommended follow up: Return in about 1 year (around 07/21/2021) for physical or sooner if needed.

## 2020-07-20 NOTE — Progress Notes (Signed)
Phone: [phone]    Subjective:  Patient presents today for their annual physical. Chief complaint-noted.   See problem oriented charting- ROS- full  review of systems was completed and negative per full ROS sheet- perhaps slight anxiety  The following were reviewed and entered/updated in epic: Past Medical History:  Diagnosis Date  . Generalized anxiety disorder   . GERD (gastroesophageal reflux disease)   . Hypogonadism in male    in past - Managed by endo - Dr. Buddy Duty. self weaned from meds and no signifcant change in symptoms.    Patient Active Problem List   Diagnosis Date Noted  . Major depression in full remission ([place]) 02/12/2017    Priority: High  . Hyperlipidemia 03/14/2014    Priority: Medium  . OSA (obstructive sleep apnea) 11/08/2011    Priority: Medium  . GAD (generalized anxiety disorder) 02/13/2011    Priority: Medium  . Gastroesophageal reflux disease 02/13/2011    Priority: Medium  . Hypogonadism male 02/13/2011    Priority: Medium  . Chronic cough 06/29/2014    Priority: Low  . MRSA infection 03/29/2011    Priority: Low   Past Surgical History:  Procedure Laterality Date  . WISDOM TOOTH EXTRACTION      Family History  Problem Relation Age of Onset  . Schizophrenia Brother        died 2 - suicide. diagnosis was unclear- brother would not get help  . Dementia Maternal Grandfather   . Healthy Mother   . Arthritis Father   . Anxiety disorder Sister   . Cancer Paternal Grandmother        unknown= in bones  . Emphysema Paternal Grandfather   . Heart Problems Paternal Grandfather        pacemaker    Medications- reviewed and updated Current Outpatient Medications  Medication Sig Dispense Refill  . esomeprazole (NEXIUM) 40 MG capsule Take one capsule by mouth daily. 90 capsule 1   No current facility-administered medications for this visit.    Allergies-reviewed and updated No Known Allergies  Social History   Social History  Narrative   Married. 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 Oropharynx normal Neck: no thyromegaly CV: RRR no murmurs rubs or gallops Lungs: CTAB no crackles, wheeze, rhonchi Abdomen: soft/nontender/nondistended/normal bowel sounds. No rebound or guarding.  Ext: no edema Skin: warm, dry Neuro: grossly normal, moves all extremities, PERRLA    Assessment and Plan:  45 y.o. male presenting for annual physical.  Health Maintenance counseling: 1. Anticipatory guidance: Patient counseled regarding regular dental exams -q6 months, eye exams - no vision issues,  avoiding smoking and second hand smoke , limiting alcohol to 2 beverages per day- 1-2 beers a week on occasion, no illicit drugs .   2. Risk factor reduction:  Advised patient of need for regular exercise and  diet rich and fruits and vegetables to reduce risk of heart attack and stroke. Exercise- did trainer at home February to June- may have tweaked shoulder with this-  Walking while girls take dance - 45 mins to an hour 4 days a week. Diet- he has tried to cut down on portion sizes- down 6 lbs from last physical- congratulated on effort. His goal is under 200.  Wt Readings from Last 3 Encounters:  07/21/20 220 lb 9.6 oz (100.1 kg)  03/15/20 229 lb (103.9 kg)  07/21/19 226 lb (102.5 kg)  3. Immunizations/screenings/ancillary studies- covid 19 vaccine -received 2 pfizer last year , HCV screen - with labs today   Immunization  History  Administered Date(s) Administered  . Tdap 10/31/2014  4. Prostate cancer screening-  no family history, start at age 85 . Had early PSA due to weak stream/dribbling in past but that had improved- UA reassuring at that time Lab Results  Component Value Date   PSA 0.21 06/25/2018   5. Colon cancer screening -  no family history, start at age 40. Stool cards 2 years ago negative for blood despite hemorrhoids  6. Skin cancer screening/prevention- last saw dermatology perhaps 2 years ago.  advised regular sunscreen use. Denies worrisome, changing, or new skin lesions.  7. Testicular cancer screening- advised monthly self exams - he agreed to do these last year 35. STD screening- patient opts  9. Never smoker  Status of chronic or acute concerns   #hyperlipidemia S: Medication: none. No CAD history in family.  Lab Results  Component Value Date   CHOL 238 (H) 06/17/2019   HDL 36.30 (L) 06/17/2019   LDLCALC 163 (H) 06/17/2019   TRIG 193.0 (H) 06/17/2019   CHOLHDL 7 06/17/2019   A/P: ascvd risk remains low as of last year at 2.5%- update 10 year risk again today with labs  #OSA- compliant wth CPAP  # GERD S:Medication: Nexium 40Mg  In past then omeprazole 40 mg and now omeprazole 20mg   - see note from last year- patient initially placed on omeprazole with some difficulty swallowing, lack of exertional chest pain. Had reassuring CXR for slight shortness of breath. Plan was refer to GI if symptoms worsen A/P: With weight loss might be reasonable to pick up pepcid/famotidine over the counter and see if that is effective for reflux- if not can try again later if have more weight loss   # Right shoulder pain- did rehab. Has not been consistent with home exercises. Still with some pain. Had seen Dr. Georgina Snell in the past. Can refer back if symptoms worsen.   # Depression S: Medication: lexapro 20 mg last year- we stepped down to 10 mg and he was able to wean off completely within last 5  months. Some moments of anxiety but overall doing well A/P: remains in full remission despite no meds- congratulated him on his progress   Recommended follow up: Return in about 1 year (around 07/21/2021) for physical or sooner if needed.  Lab/Order associations:NOT fasting- coffee with some creamer   ICD-10-CM   1. Preventative health care  Z00.00   2. Gastroesophageal reflux disease without esophagitis  K21.9   3. Recurrent major depressive disorder, in full remission ([place])  F33.42   4. Hyperlipidemia, unspecified hyperlipidemia type  E78.5   5. GAD (generalized anxiety disorder)  F41.1   6. Encounter for hepatitis C screening test for low risk patient  Z11.59     No orders of the defined types were placed in  this encounter.   Return precautions advised.   Garret Reddish, MD

## 2020-07-21 ENCOUNTER — Ambulatory Visit (INDEPENDENT_AMBULATORY_CARE_PROVIDER_SITE_OTHER): Payer: BC Managed Care – PPO | Admitting: Family Medicine

## 2020-07-21 ENCOUNTER — Encounter: Payer: Self-pay | Admitting: Family Medicine

## 2020-07-21 ENCOUNTER — Other Ambulatory Visit: Payer: Self-pay

## 2020-07-21 VITALS — BP 108/76 | HR 55 | Temp 97.6°F | Ht 73.0 in | Wt 220.6 lb

## 2020-07-21 DIAGNOSIS — Z79899 Other long term (current) drug therapy: Secondary | ICD-10-CM | POA: Diagnosis not present

## 2020-07-21 DIAGNOSIS — E785 Hyperlipidemia, unspecified: Secondary | ICD-10-CM

## 2020-07-21 DIAGNOSIS — F411 Generalized anxiety disorder: Secondary | ICD-10-CM

## 2020-07-21 DIAGNOSIS — Z0000 Encounter for general adult medical examination without abnormal findings: Secondary | ICD-10-CM | POA: Diagnosis not present

## 2020-07-21 DIAGNOSIS — Z1159 Encounter for screening for other viral diseases: Secondary | ICD-10-CM

## 2020-07-21 DIAGNOSIS — F3342 Major depressive disorder, recurrent, in full remission: Secondary | ICD-10-CM | POA: Diagnosis not present

## 2020-07-21 DIAGNOSIS — K219 Gastro-esophageal reflux disease without esophagitis: Secondary | ICD-10-CM

## 2020-07-21 LAB — LIPID PANEL
Cholesterol: 247 mg/dL — ABNORMAL HIGH (ref 0–200)
HDL: 34.5 mg/dL — ABNORMAL LOW (ref >39.00)
LDL Cholesterol: 180 mg/dL — ABNORMAL HIGH (ref 0–99)
NonHDL: 212.41
Total CHOL/HDL Ratio: 7
Triglycerides: 161 mg/dL — ABNORMAL HIGH (ref 0.0–149.0)
VLDL: 32.2 mg/dL (ref 0.0–40.0)

## 2020-07-21 LAB — COMPREHENSIVE METABOLIC PANEL
ALT: 37 U/L (ref 0–53)
AST: 21 U/L (ref 0–37)
Albumin: 4.7 g/dL (ref 3.5–5.2)
Alkaline Phosphatase: 55 U/L (ref 39–117)
BUN: 12 mg/dL (ref 6–23)
CO2: 29 mEq/L (ref 19–32)
Calcium: 9.8 mg/dL (ref 8.4–10.5)
Chloride: 101 mEq/L (ref 96–112)
Creatinine, Ser: 1.03 mg/dL (ref 0.40–1.50)
GFR: 88.95 mL/min (ref >60.00)
Glucose, Bld: 91 mg/dL (ref 70–99)
Potassium: 4.2 mEq/L (ref 3.5–5.1)
Sodium: 137 mEq/L (ref 135–145)
Total Bilirubin: 0.7 mg/dL (ref 0.2–1.2)
Total Protein: 7.3 g/dL (ref 6.0–8.3)

## 2020-07-21 LAB — CBC WITH DIFFERENTIAL/PLATELET
Basophils Absolute: 0 10*3/uL (ref 0.0–0.1)
Basophils Relative: 0.5 % (ref 0.0–3.0)
Eosinophils Absolute: 0.2 10*3/uL (ref 0.0–0.7)
Eosinophils Relative: 3.4 % (ref 0.0–5.0)
HCT: 43.4 % (ref 39.0–52.0)
Hemoglobin: 14.9 g/dL (ref 13.0–17.0)
Lymphocytes Relative: 32.8 % (ref 12.0–46.0)
Lymphs Abs: 2.1 10*3/uL (ref 0.7–4.0)
MCHC: 34.3 g/dL (ref 30.0–36.0)
MCV: 86.8 fl (ref 78.0–100.0)
Monocytes Absolute: 0.6 10*3/uL (ref 0.1–1.0)
Monocytes Relative: 9.9 % (ref 3.0–12.0)
Neutro Abs: 3.4 10*3/uL (ref 1.4–7.7)
Neutrophils Relative %: 53.4 % (ref 43.0–77.0)
Platelets: 228 10*3/uL (ref 150.0–400.0)
RBC: 5 Mil/uL (ref 4.22–5.81)
RDW: 13.1 % (ref 11.5–15.5)
WBC: 6.3 10*3/uL (ref 4.0–10.5)

## 2020-07-21 LAB — VITAMIN B12: Vitamin B-12: 416 pg/mL (ref 211–911)

## 2020-07-24 LAB — HEPATITIS C ANTIBODY
Hepatitis C Ab: NONREACTIVE
SIGNAL TO CUT-OFF: 0.01 (ref <1.00)

## 2020-09-20 DIAGNOSIS — M9902 Segmental and somatic dysfunction of thoracic region: Secondary | ICD-10-CM | POA: Diagnosis not present

## 2020-09-20 DIAGNOSIS — M542 Cervicalgia: Secondary | ICD-10-CM | POA: Diagnosis not present

## 2020-09-20 DIAGNOSIS — M9901 Segmental and somatic dysfunction of cervical region: Secondary | ICD-10-CM | POA: Diagnosis not present

## 2020-09-20 DIAGNOSIS — M546 Pain in thoracic spine: Secondary | ICD-10-CM | POA: Diagnosis not present

## 2020-10-12 ENCOUNTER — Ambulatory Visit: Payer: BC Managed Care – PPO | Admitting: Family Medicine

## 2020-10-12 ENCOUNTER — Other Ambulatory Visit: Payer: Self-pay

## 2020-10-12 ENCOUNTER — Encounter: Payer: Self-pay | Admitting: Family Medicine

## 2020-10-12 VITALS — BP 126/88 | HR 59 | Temp 97.9°F | Ht 73.0 in | Wt 211.8 lb

## 2020-10-12 DIAGNOSIS — F411 Generalized anxiety disorder: Secondary | ICD-10-CM

## 2020-10-12 DIAGNOSIS — F332 Major depressive disorder, recurrent severe without psychotic features: Secondary | ICD-10-CM | POA: Diagnosis not present

## 2020-10-12 MED ORDER — LORAZEPAM 0.5 MG PO TABS: 0.5000 mg | ORAL_TABLET | Freq: Two times a day (BID) | ORAL | 0 refills | Status: DC | PRN

## 2020-10-12 MED ORDER — ESCITALOPRAM OXALATE 20 MG PO TABS: 20.0000 mg | ORAL_TABLET | Freq: Every day | ORAL | 3 refills | Status: DC

## 2020-10-12 NOTE — Patient Instructions (Addendum)
Health Maintenance Due  Topic Date Due   COVID-19 Vaccine (1) Patient will call or MyChart [place] the dates.  Never done   In regards to you anxiety, please continue trial of half a tablet of Lexapro 20 mg- which is 10 mg of Lexapro- for one week. After one week, increase to full dose of 20 mg. You can also try Lorazepam or Ativan. Please do not drive for 8 hours after taking the medication and do not drink alcohol.  Taking the medicine as directed and not missing any doses is one of the best things you can do to treat your depression.  Here are some things to keep in mind:  Side effects (stomach upset, some increased anxiety) may happen before you notice a benefit.  These side effects typically go away over time. Changes to your dose of medicine or a change in medication all together is sometimes necessary Most people need to be on medication at least 6-12 months Many people will notice an improvement within two weeks but the full effect of the medication can take up to 4-6 weeks Stopping the medication when you start feeling better often results in a return of symptoms If you start having thoughts of hurting yourself or others after starting this medicine, call our office immediately at [phone] or seek care through 911.     In regards to looking for therapy, please call [phone] to schedule a visit with [facility] or or you could try [email or web address].  Recommended follow up: Return in about 1 month (around 11/11/2020) for Follow-up- can use same day slot if needed.

## 2020-10-12 NOTE — Progress Notes (Signed)
Phone [phone] In person visit   Subjective:   Thomas Duran is a 45 y.o. year old very pleasant male patient who presents for/with See problem oriented charting Chief Complaint  Patient presents with   Anxiety    Patient states that it's a little overbearing     This visit occurred during the SARS-CoV-2 public health emergency.  Safety protocols were in place, including screening questions prior to the visit, additional usage of staff PPE, and extensive cleaning of exam room while observing appropriate contact time as indicated for disinfecting solutions.   Past Medical History-  Patient Active Problem List   Diagnosis Date Noted   Major depression in full remission ([place]) 02/12/2017    Priority: High   Hyperlipidemia 03/14/2014    Priority: Medium   OSA (obstructive sleep apnea) 11/08/2011    Priority: Medium   GAD (generalized anxiety disorder) 02/13/2011    Priority: Medium   Gastroesophageal reflux disease 02/13/2011    Priority: Medium   Hypogonadism male 02/13/2011    Priority: Medium   Chronic cough 06/29/2014    Priority: Low   MRSA infection 03/29/2011    Priority: Low    Medications- reviewed and updated Current Outpatient Medications  Medication Sig Dispense Refill   escitalopram (LEXAPRO) 20 MG tablet Take 20 mg by mouth daily. Taking 1/2 tablet daily for anxiety.     LORazepam (ATIVAN) 0.5 MG tablet Take 1 tablet (0.5 mg total) by mouth 2 (two) times daily as needed for anxiety (do not drive for 8 hours after taking and do not drink with alcohol). 24 tablet 0   omeprazole (PRILOSEC) 20 MG capsule Take 20 mg by mouth daily.     No current facility-administered medications for this visit.     Objective:  BP 126/88   Pulse (!) 59   Temp 97.9 F (36.6 C) (Temporal)   Ht 6\' 1"  (1.854 m)   Wt 211 lb 12.8 oz (96.1 kg)   SpO2 98%   BMI 27.94 kg/m  Gen: NAD, resting comfortably CV: RRR no murmurs rubs or gallops Lungs: CTAB no crackles, wheeze,  rhonchi Ext: no edema Skin: warm, dry    Assessment and Plan  #GAD #Depression S: Last visit in April patient reported weaning off of Lexapro 20 mg over time and doing well other than some intermittent anxiety.  Unfortunately he has had significant worsening since that time. Patient states a few weeks ago work was getting a lot busier and stressful- June 20th became completely overwhelmed- yesterday was not able to work in the afternoon. Worry has completely overcome him and feels he struggles to do his job. Slept all day Saturday and Sunday and very low appetite. Has had a lot of heavy sighs and has had some heavy pacing. Feels a pit in his stomach.   He actually restarted Lexapro 10 mg on Saturday (half of 20 mg tablet)- has not noted improvement  States he feels terrible and has thoughts of looking for relief and that he would be better off dead- no thoughts of self harm.   Occasional mild chest tightness but no exertional symptoms GAD 7 : Generalized Anxiety Score 10/12/2020 03/14/2017 02/12/2017  Nervous, Anxious, on Edge 3 3 3   Control/stop worrying 3 3 3   Worry too much - different things 3 3 3   Trouble relaxing 3 2 2   Restless 3 1 1   Easily annoyed or irritable 3 2 3   Afraid - awful might happen 3 2 3   Total GAD  7 Score 21 16 18   Anxiety Difficulty Extremely difficult Very difficult Very difficult   Depression screen [facility] 2/9 10/12/2020 07/21/2020 06/17/2019  Decreased Interest 3 0 0  Down, Depressed, Hopeless 3 0 0  PHQ - 2 Score 6 0 0  Altered sleeping 3 0 2  Tired, decreased energy 3 0 1  Change in appetite 3 0 0  Feeling bad or failure about yourself  3 0 0  Trouble concentrating 3 0 0  Moving slowly or fidgety/restless 3 0 0  Suicidal thoughts 1 0 0  PHQ-9 Score 25 0 3  Difficult doing work/chores Extremely dIfficult Not difficult at all Not difficult at all  A/P: GAD and Depression with significant worsening off of meds. We discussed plan for lifelong meds  From AVS "   Continue half a tablet of Lexapro 20 mg- which is 10 mg of Lexapro- for one week. After one week, increase to full dose of 20 mg. You can also use lorazepam or Ativan. Do not drive for 8 hours after taking the medication and do not drink alcohol.  Taking the medicine as directed and not missing any doses is one of the best things you can do to treat your depression.  Here are some things to keep in mind:  Side effects (stomach upset, some increased anxiety) may happen before you notice a benefit.  These side effects typically go away over time. Changes to your dose of medicine or a change in medication all together is sometimes necessary Most people need to be on medication at least 6-12 months Many people will notice an improvement within two weeks but the full effect of the medication can take up to 4-6 weeks Stopping the medication when you start feeling better often results in a return of symptoms If you start having thoughts of hurting yourself or others after starting this medicine, call our office immediately at [phone] or seek care through 911.   "   Recommended follow up: 1 month follow up or up to 6 weeks Future Appointments  Date Time Provider [place]  07/27/2021  8:00 AM Marin Olp, MD [facility]    Lab/Order associations:   ICD-10-CM   1. GAD (generalized anxiety disorder)  F41.1     2. Severe episode of recurrent major depressive disorder, without psychotic features ([place])  F33.2       Meds ordered this encounter  Medications   LORazepam (ATIVAN) 0.5 MG tablet    Sig: Take 1 tablet (0.5 mg total) by mouth 2 (two) times daily as needed for anxiety (do not drive for 8 hours after taking and do not drink with alcohol).    Dispense:  24 tablet    Refill:  0   I,Harris Phan,acting as a scribe for Garret Reddish, MD.,have documented all relevant documentation on the behalf of Garret Reddish, MD,as directed by  Garret Reddish, MD while in the presence of  Garret Reddish, MD.  I, Garret Reddish, MD, have reviewed all documentation for this visit. The documentation on 10/12/20 for the exam, diagnosis, procedures, and orders are all accurate and complete.  Return precautions advised.  Garret Reddish, MD

## 2020-10-17 ENCOUNTER — Encounter: Payer: Self-pay | Admitting: Family Medicine

## 2020-10-17 DIAGNOSIS — R0789 Other chest pain: Secondary | ICD-10-CM | POA: Diagnosis not present

## 2020-10-17 DIAGNOSIS — R059 Cough, unspecified: Secondary | ICD-10-CM | POA: Diagnosis not present

## 2020-10-18 ENCOUNTER — Telehealth: Payer: Self-pay

## 2020-10-18 NOTE — Telephone Encounter (Signed)
Nurse Assessment Nurse: Durene Cal, RN, Brandi Date/Time (Eastern Time): 10/17/2020 6:07:01 PM Confirm and document reason for call. If symptomatic, describe symptoms. ---Caller states her husband is having a anxiety, crying non stop, and cannot sleep, trouble eating. Husband states he wants to drown in the ocean. Does the patient have any new or worsening symptoms? ---Yes Will a triage be completed? ---Yes Related visit to physician within the last 2 weeks? ---Yes Does the PT have any chronic conditions? (i.e. diabetes, asthma, this includes High risk factors for pregnancy, etc.) ---Yes List chronic conditions. ---Anxiety Is this a behavioral health or substance abuse call? ---No Guidelines Guideline Title Affirmed Question Affirmed Notes Nurse Date/Time (Eastern Time) Anxiety and Panic Attack Difficult to awaken or acting confused (e.g., disoriented, slurred speech) Durene Cal, RN, Brandi 10/17/2020 6:11:03 PM PLEASE NOTE: All timestamps contained within this report are represented as [place] Standard Time. CONFIDENTIALTY NOTICE: This fax transmission is intended only for the addressee. It contains information that is legally privileged, confidential or otherwise protected from use or disclosure. If you are not the intended recipient, you are strictly prohibited from reviewing, disclosing, copying using or disseminating any of this information or taking any action in reliance on or regarding this information. If you have received this fax in error, please notify us immediately by telephone so that we can arrange for its return to [place]. Phone: [phone], Toll-Free: [phone], Fax: [phone] Page: 2 of 2 Call Id: [id number] [place]. Time Eilene Ghazi Time) Disposition Final User 10/17/2020 6:05:38 PM Send to Urgent [place] 10/17/2020 6:15:55 PM [place], RN, Velna Hatchet Reason: Refused EMS 10/17/2020 6:14:24 PM Call EMS 911 Now Yes Durene Cal,  RN, Berdie Ogren Disagree/Comply Disagree Caller Understands Yes PreDisposition InappropriateToAsk Care Advice Given Per Guideline CALL EMS 911 NOW: * Immediate medical attention is needed. You need to hang up and call 911 (or an ambulance). Comments User: Ermalene Postin, RN Date/Time Eilene Ghazi Time): 10/17/2020 6:09:37 PM Reports he was seen by MD last week. Started MEDICATIONS: Patient was on Lexapro prior to march but had been on a break with it. Just started Ativan. lexapro 20mg  ativan .5mg  twice a day User: Ermalene Postin, RN Date/Time (Eastern Time): 10/17/2020 6:15:31 PM Family reports he is not suicidal, but that he is acting out of character, disoriented and has chest tightness. EMS refused. Referrals GO TO FACILITY OTHER - SPECIFY

## 2020-10-18 NOTE — Telephone Encounter (Signed)
Called and lm on Kristen vm tcb, mychart message sent as well.

## 2020-10-18 NOTE — Telephone Encounter (Signed)
See prior mychart message.

## 2020-10-25 DIAGNOSIS — F419 Anxiety disorder, unspecified: Secondary | ICD-10-CM | POA: Diagnosis not present

## 2020-10-25 MED ORDER — LORAZEPAM 1 MG PO TABS: 1.0000 mg | ORAL_TABLET | Freq: Two times a day (BID) | ORAL | 1 refills | Status: DC | PRN

## 2020-11-06 DIAGNOSIS — F40248 Other situational type phobia: Secondary | ICD-10-CM | POA: Diagnosis not present

## 2020-11-06 DIAGNOSIS — F329 Major depressive disorder, single episode, unspecified: Secondary | ICD-10-CM | POA: Diagnosis not present

## 2020-11-06 DIAGNOSIS — F411 Generalized anxiety disorder: Secondary | ICD-10-CM | POA: Diagnosis not present

## 2020-11-06 DIAGNOSIS — F410 Panic disorder [episodic paroxysmal anxiety] without agoraphobia: Secondary | ICD-10-CM | POA: Diagnosis not present

## 2020-11-09 ENCOUNTER — Ambulatory Visit: Payer: BC Managed Care – PPO | Admitting: Family Medicine

## 2020-11-09 ENCOUNTER — Encounter: Payer: Self-pay | Admitting: Family Medicine

## 2020-11-09 ENCOUNTER — Other Ambulatory Visit: Payer: Self-pay

## 2020-11-09 VITALS — BP 111/69 | HR 64 | Temp 98.7°F | Ht 73.0 in | Wt 207.2 lb

## 2020-11-09 DIAGNOSIS — F325 Major depressive disorder, single episode, in full remission: Secondary | ICD-10-CM

## 2020-11-09 DIAGNOSIS — F411 Generalized anxiety disorder: Secondary | ICD-10-CM | POA: Diagnosis not present

## 2020-11-09 NOTE — Patient Instructions (Addendum)
Health Maintenance Due  Topic Date Due   COVID-19 Vaccine (1) Will Mychart me the dates.  Never done   I am glad to see some improvement with your depression/GAD scores today! Hopefully we can see more progress in you level of anxiety.  I am sorry you did not have a good experience with [facility]. I do encourage you to trial with your other two therapeutic options that we discussed today and see how well everything goes. Start with already planned visit and if not a good fit can try [facility]  Please continue Lexapro 20 mg.   Only take Ativan when needed if symptoms are severe- can move to more as needed now  Recommended follow up: Return in about 1 month (around 12/10/2020) for follow-up or sooner if needed.

## 2020-11-09 NOTE — Progress Notes (Signed)
Phone [phone] In person visit   Subjective:   Thomas Duran is a 45 y.o. year old very pleasant male patient who presents for/with See problem oriented charting Chief Complaint  Patient presents with   Anxiety   Depression    This visit occurred during the SARS-CoV-2 public health emergency.  Safety protocols were in place, including screening questions prior to the visit, additional usage of staff PPE, and extensive cleaning of exam room while observing appropriate contact time as indicated for disinfecting solutions.   Past Medical History-  Patient Active Problem List   Diagnosis Date Noted   Major depression in full remission ([place]) 02/12/2017    Priority: High   Hyperlipidemia 03/14/2014    Priority: Medium   OSA (obstructive sleep apnea) 11/08/2011    Priority: Medium   GAD (generalized anxiety disorder) 02/13/2011    Priority: Medium   Gastroesophageal reflux disease 02/13/2011    Priority: Medium   Hypogonadism male 02/13/2011    Priority: Medium   Chronic cough 06/29/2014    Priority: Low   MRSA infection 03/29/2011    Priority: Low    Medications- reviewed and updated Current Outpatient Medications  Medication Sig Dispense Refill   escitalopram (LEXAPRO) 20 MG tablet Take 1 tablet (20 mg total) by mouth daily. 90 tablet 3   LORazepam (ATIVAN) 1 MG tablet Take 1 tablet (1 mg total) by mouth 2 (two) times daily as needed for anxiety (do not drive for 8 hours after taking). 20 tablet 1   omeprazole (PRILOSEC) 20 MG capsule Take 20 mg by mouth daily.     No current facility-administered medications for this visit.     Objective:  BP 111/69   Pulse 64   Temp 98.7 F (37.1 C) (Temporal)   Ht '6\' 1"'$  (1.854 m)   Wt 207 lb 3.2 oz (94 kg)   SpO2 96%   BMI 27.34 kg/m  Gen: NAD, resting comfortably CV: RRR no murmurs rubs or gallops Lungs: CTAB no crackles, wheeze, rhonchi Abdomen: soft/nontender/nondistended/normal bowel sounds.  Ext: no  edema Skin: warm, dry     Assessment and Plan   # Depression/GAD S: Medication: lexapro 20 mg in past- weaned off in 2022 but with later significant recurrence of symptoms and had to restart, Lorazepam 1 mg twice daily as needed was added due to intense anxiety not controlled at 0.'5mg'$  dose  - he became completely overwhelmed by June 20th and we took him out of work at last visit on June 30th.   -if he had to rate anxiety would say was at a 10 and now down to 7. He is on lexapro 20 mg. He has been taking the ativan 1 mg daily- tried off this morning and feels he is doing alright- he is not entirely sure if he is doing better. Sometimes taking twice a day  Had a visit in ER at [facility] on 5th or 6th- was having gut wrenching anxiety- had EKG, CXR and was given 2 mg of ativan - seemed to take the edge off- has done better on 1 mg since then.   Trying to relax at night- often dosing off by 8 am- gets to bed by 10 and wakes up around 3 30 or 4 and feels more anxious at that time- scared and anxious abou tnot falling asleep. Motivation remains low. No thoughts of self harm in last 2 weeks thankfully. Look sforward to the evening when he can rest  [facility] and  was told they were not taking new patients. Wife had a recommendation and he has a virtual appointment on upcoming Monday. He has also through insurance found an appointment through [facility]- had appointment this week that was not very helpful.   Went on a retreat through his church Friday through Sunday- helped him some but felt like anxiety and motivation being low inhibited some of potential progress  He has been able to work through this getting in one phone call at a time.  Depression screen [facility] 2/9 11/09/2020 10/12/2020 07/21/2020 06/17/2019 06/22/2018  Decreased Interest 3 3 0 0 1  Down, Depressed, Hopeless 1 3 0 0 0  PHQ - 2 Score 4 6 0 0 1  Altered sleeping 3 3 0 2 1  Tired, decreased energy 3 3 0 1 1  Change in  appetite 1 3 0 0 0  Feeling bad or failure about yourself  1 3 0 0 0  Trouble concentrating 1 3 0 0 1  Moving slowly or fidgety/restless 2 3 0 0 0  Suicidal thoughts 0 1 0 0 0  PHQ-9 Score 15 25 0 3 4  Difficult doing work/chores Somewhat difficult Extremely dIfficult Not difficult at all Not difficult at all Not difficult at all  Some recent data might be hidden    GAD 7 : Generalized Anxiety Score 11/09/2020 10/12/2020 03/14/2017 02/12/2017  Nervous, Anxious, on Edge '3 3 3 3  '$ Control/stop worrying '3 3 3 3  '$ Worry too much - different things '3 3 3 3  '$ Trouble relaxing '1 3 2 2  '$ Restless 0 '3 1 1  '$ Easily annoyed or irritable 0 '3 2 3  '$ Afraid - awful might happen '1 3 2 3  '$ Total GAD 7 Score '11 21 16 18  '$ Anxiety Difficulty Somewhat difficult Extremely difficult Very difficult Very difficult  A/P: Significant improvement in PHQ-9 down from 25-15 and GAD-7 down from 21-11.  Patient reports improvement about 30% with overall anxiety/depression down from perhaps a 10 to a 7.  Depression in partial remission.  GAD improving but still not well controlled.  Patient has only been on Lexapro 20 mg approximately 4 weeks and has only had 1 therapy session with the therapist he did not feel very confident with-he has another option coming up soon.  We opted to continue current medication of Lexapro 20 mg and continue efforts with therapist.  At this point since he is improving encouraged him to try using the Ativan more on an as-needed basis instead of daily -did not tolerate Wellbutrin in the past -Did consider potentially switching to Pristiq but we may do this at a later date if not improving -1 month follow-up -Has feeling of unrest in his abdomen which we are hoping will improve by next visit  Recommended follow up: Return in about 1 month (around 12/10/2020) for follow-up or sooner if needed. Future Appointments  Date Time Provider [place]  07/27/2021  8:00 AM Marin Olp, MD [facility]    Lab/Order associations:   ICD-10-CM   1. GAD (generalized anxiety disorder)  F41.1     2. Major depressive disorder in full remission, unspecified whether recurrent ([place])  F32.5      I,Harris Phan,acting as a scribe for Garret Reddish, MD.,have documented all relevant documentation on the behalf of Garret Reddish, MD,as directed by  Garret Reddish, MD while in the presence of Garret Reddish, MD.  I, Garret Reddish, MD, have reviewed all documentation for this visit. The documentation  on 11/09/20 for the exam, diagnosis, procedures, and orders are all accurate and complete.   Return precautions advised.  Garret Reddish, MD

## 2020-11-13 DIAGNOSIS — F419 Anxiety disorder, unspecified: Secondary | ICD-10-CM | POA: Diagnosis not present

## 2020-11-14 ENCOUNTER — Other Ambulatory Visit: Payer: Self-pay | Admitting: Family Medicine

## 2020-11-14 NOTE — Telephone Encounter (Signed)
Pt is requesting Lorazepam/Ativan 1 mg tab LOV: 11/09/2020 Next Visit: 12/15/2020 Last refill: 11/01/2020  Approve?

## 2020-11-23 DIAGNOSIS — F419 Anxiety disorder, unspecified: Secondary | ICD-10-CM | POA: Diagnosis not present

## 2020-12-07 DIAGNOSIS — M546 Pain in thoracic spine: Secondary | ICD-10-CM | POA: Diagnosis not present

## 2020-12-07 DIAGNOSIS — M9901 Segmental and somatic dysfunction of cervical region: Secondary | ICD-10-CM | POA: Diagnosis not present

## 2020-12-07 DIAGNOSIS — M542 Cervicalgia: Secondary | ICD-10-CM | POA: Diagnosis not present

## 2020-12-07 DIAGNOSIS — M9902 Segmental and somatic dysfunction of thoracic region: Secondary | ICD-10-CM | POA: Diagnosis not present

## 2020-12-11 NOTE — Progress Notes (Signed)
Phone [phone] In person visit   Subjective:   Thomas Duran is a 45 y.o. year old very pleasant male patient who presents for/with See problem oriented charting Chief Complaint  Patient presents with   Anxiety    Currently on Lexapro.    This visit occurred during the SARS-CoV-2 public health emergency.  Safety protocols were in place, including screening questions prior to the visit, additional usage of staff PPE, and extensive cleaning of exam room while observing appropriate contact time as indicated for disinfecting solutions.   Past Medical History-  Patient Active Problem List   Diagnosis Date Noted   Major depression in full remission ([place]) 02/12/2017    Priority: High   Hyperlipidemia 03/14/2014    Priority: Medium   OSA (obstructive sleep apnea) 11/08/2011    Priority: Medium   GAD (generalized anxiety disorder) 02/13/2011    Priority: Medium   Gastroesophageal reflux disease 02/13/2011    Priority: Medium   Hypogonadism male 02/13/2011    Priority: Medium   Chronic cough 06/29/2014    Priority: Low   MRSA infection 03/29/2011    Priority: Low    Medications- reviewed and updated Current Outpatient Medications  Medication Sig Dispense Refill   escitalopram (LEXAPRO) 20 MG tablet Take 1 tablet (20 mg total) by mouth daily. 90 tablet 3   omeprazole (PRILOSEC) 20 MG capsule Take 20 mg by mouth daily.     LORazepam (ATIVAN) 1 MG tablet TAKE 1 TABLET (1 MG TOTAL) BY MOUTH 2 (TWO) TIMES DAILY AS NEEDED FOR ANXIETY (DO NOT DRIVE FOR 8 HOURS AFTER TAKING). (Patient not taking: Reported on 12/15/2020) 20 tablet 1   No current facility-administered medications for this visit.     Objective:  BP 110/62   Pulse 64   Temp 98.3 F (36.8 C) (Temporal)   Ht '6\' 1"'$  (1.854 m)   Wt 210 lb (95.3 kg)   SpO2 96%   BMI 27.71 kg/m  Gen: NAD, resting comfortably CV: RRR no murmurs rubs or gallops Lungs: CTAB no crackles, wheeze, rhonchi Ext: no edema Skin: warm,  dry Left neck tension on exam- normal ROM to right, moving to left feels pain. Hawkin test positive, limited to 135 degrees abduction shoulder due to pain    Assessment and Plan   # Depression/GAD S: Medication: lexapro 20 mg in past- weaned off in 2022 but with later significant recurrence of symptoms and had to restart, Lorazepam 1 mg twice daily as needed was added due to intense anxiety not controlled at 0.'5mg'$  dose. Ativan 1 mg twice daily as needed  - he became completely overwhelmed by June 20th 2022 and we took him out of work at last visit on June 30th.   -therapy : [facility]ried [facility] and was told they were not taking new patients. Wife had a recommendation and he had a virtual appointment on that upcoming Monday. He had also through insurance found an appointment through [facility]- had appointment this week that was not very helpful.   - last visit: Significant improvement in PHQ-9 down from 25-15 and GAD-7 down from 21-11.  Patient reported improvement about 30% with overall anxiety/depression down from perhaps a 10 to a 7.   Since improvement, encouraged him to try Ativan more-as needed basis.Did consider to potentially switch to Pristiq but we may do this at a later date if did not improved  - today reports  continued improvement. Biggest issue right now is getting 3 30 or 4 AM  and has anxiety feeling in upper abdomen. Doesn't feel rested. Goes to bed at 10. PHQ9 down to 7 and GAD7 down to 6. Takes Lexapro in the morning between 6-6 30 AM. Has not taken the ativan in the last month- gets to work now and things actually improve. Sometimes benadryl before bed with some relief. Waking up like that even on the weekends. This was happenign before last visit. Tv up to 30 mins before be- agrees to cut down - He is seeing a new therapist Waymon Budge Depression screen [facility] 2/9 12/15/2020 11/09/2020 10/12/2020  Decreased Interest '1 3 3  '$ Down, Depressed, Hopeless 0 1 3  PHQ - 2 Score '1 4 6   '$ Altered sleeping '3 3 3  '$ Tired, decreased energy '3 3 3  '$ Change in appetite 0 1 3  Feeling bad or failure about yourself  0 1 3  Trouble concentrating 0 1 3  Moving slowly or fidgety/restless 0 2 3  Suicidal thoughts 0 0 1  PHQ-9 Score '7 15 25  '$ Difficult doing work/chores Not difficult at all Somewhat difficult Extremely dIfficult  Some recent data might be hidden   GAD 7 : Generalized Anxiety Score 12/15/2020 11/09/2020 10/12/2020 03/14/2017  Nervous, Anxious, on Edge '1 3 3 3  '$ Control/stop worrying '1 3 3 3  '$ Worry too much - different things '1 3 3 3  '$ Trouble relaxing '1 1 3 2  '$ Restless 0 0 3 1  Easily annoyed or irritable 1 0 3 2  Afraid - awful might happen '1 1 3 2  '$ Total GAD 7 Score '6 11 21 16  '$ Anxiety Difficulty Not difficult at all Somewhat difficult Extremely difficult Very difficult  A/P: significant improvement in anxiety and depressed mood- lingering main issue right now is sleep. We are going to try 14 days of melatonin 1-3 mg and if not improving may try a week of 0.5 mg ativan before bed (half tablet). This could be related to lexapro and we may need to look at alternate but will hold off on change for now- he will update me 3 weeks.  -Has not tolerated Wellbutrin in the past -Have considered Pristiq if patient fails to improve-could continue to consider  -Sensation of unrest and abdomen better other than when wakes up   # Neck tension #right shoulder pain S:patient waking up with tension in left side of neck- up to a year. Has tried new pillows without any difference. Has gone to chiropractor. Right shoulder issues for at least 1.5 years - and done PT for that in 2021- helped but then worsened again- had seen Dr. Georgina Snell at that time. No paresthesias or weakness. Occasional pain into right bicep A/P: neck tension for up to a year and  right shoulder pain up to 1.5 years- refer back to sports medicine. Want Dr. Tamala Julian opinion to see if manipulatoin may be helpful- also discussed may  need steroid injectoin  Recommended follow up: keep April visit unless symptoms worsen Future Appointments  Date Time Provider [place]  07/27/2021  8:00 AM Marin Olp, MD [facility]    Lab/Order associations:   ICD-10-CM   1. GAD (generalized anxiety disorder)  F41.1     2. Major depressive disorder in full remission, unspecified whether recurrent ([place])  F32.5     3. Neck pain on left side  M54.2 Ambulatory referral to Sports Medicine    4. Chronic right shoulder pain  M25.511 Ambulatory referral to Sports Medicine   [phone]  No orders of the defined types were placed in this encounter.   I,Jada Bradford,acting as a scribe for Garret Reddish, MD.,have documented all relevant documentation on the behalf of Garret Reddish, MD,as directed by  Garret Reddish, MD while in the presence of Garret Reddish, MD.  I, Garret Reddish, MD, have reviewed all documentation for this visit. The documentation on 12/15/20 for the exam, diagnosis, procedures, and orders are all accurate and complete.  Return precautions advised.  Garret Reddish, MD

## 2020-12-15 ENCOUNTER — Ambulatory Visit: Payer: BC Managed Care – PPO | Admitting: Family Medicine

## 2020-12-15 ENCOUNTER — Other Ambulatory Visit: Payer: Self-pay

## 2020-12-15 ENCOUNTER — Encounter: Payer: Self-pay | Admitting: Family Medicine

## 2020-12-15 VITALS — BP 110/62 | HR 64 | Temp 98.3°F | Ht 73.0 in | Wt 210.0 lb

## 2020-12-15 DIAGNOSIS — M25511 Pain in right shoulder: Secondary | ICD-10-CM

## 2020-12-15 DIAGNOSIS — F325 Major depressive disorder, single episode, in full remission: Secondary | ICD-10-CM

## 2020-12-15 DIAGNOSIS — G8929 Other chronic pain: Secondary | ICD-10-CM

## 2020-12-15 DIAGNOSIS — F411 Generalized anxiety disorder: Secondary | ICD-10-CM

## 2020-12-15 DIAGNOSIS — M542 Cervicalgia: Secondary | ICD-10-CM

## 2020-12-15 DIAGNOSIS — K219 Gastro-esophageal reflux disease without esophagitis: Secondary | ICD-10-CM

## 2020-12-15 NOTE — Patient Instructions (Addendum)
[facility] Melatonin 1-3 mg before bed by 30 mins for 2 weeks. If not improving, try half of tablet of Lorazepam before bed for the 5-7 days - cut any screen time out 30 minutes before bed.  We will call you within two weeks about your referral to Sports Medicine with [facility]. If you do not hear within 2 weeks, give [place] a call.    Recommended follow up: Return in about 7 months (around 07/15/2021) for follow-up or sooner if needed.Keep April visit. Unless you have worsening symptoms- we will try to work on sleep through [employer]- update me in 3 weeks

## 2020-12-22 ENCOUNTER — Other Ambulatory Visit: Payer: Self-pay

## 2020-12-22 ENCOUNTER — Ambulatory Visit: Payer: BC Managed Care – PPO | Admitting: Sports Medicine

## 2020-12-22 VITALS — BP 110/70 | HR 60 | Ht 73.0 in | Wt 210.0 lb

## 2020-12-22 DIAGNOSIS — M9908 Segmental and somatic dysfunction of rib cage: Secondary | ICD-10-CM

## 2020-12-22 DIAGNOSIS — M9902 Segmental and somatic dysfunction of thoracic region: Secondary | ICD-10-CM

## 2020-12-22 DIAGNOSIS — G8929 Other chronic pain: Secondary | ICD-10-CM | POA: Diagnosis not present

## 2020-12-22 DIAGNOSIS — S161XXA Strain of muscle, fascia and tendon at neck level, initial encounter: Secondary | ICD-10-CM | POA: Diagnosis not present

## 2020-12-22 DIAGNOSIS — M9903 Segmental and somatic dysfunction of lumbar region: Secondary | ICD-10-CM

## 2020-12-22 DIAGNOSIS — M9901 Segmental and somatic dysfunction of cervical region: Secondary | ICD-10-CM

## 2020-12-22 DIAGNOSIS — M25511 Pain in right shoulder: Secondary | ICD-10-CM | POA: Diagnosis not present

## 2020-12-22 DIAGNOSIS — M9905 Segmental and somatic dysfunction of pelvic region: Secondary | ICD-10-CM

## 2020-12-22 MED ORDER — MELOXICAM 15 MG PO TABS: 15.0000 mg | ORAL_TABLET | Freq: Every day | ORAL | 0 refills | Status: DC

## 2020-12-22 NOTE — Progress Notes (Signed)
Thomas Duran   Assessment and Plan:    1. Chronic pain in right shoulder -Acute on chronic, worsening, initial sports medicine visit for this acute flare - Likely subacromial bursitis based on HPI, physical exam - Patient elects for CSI.  Tolerated well per procedure note below - Start meloxicam 15 mg daily x2 weeks.  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  2. Strain of neck muscle, initial encounter  3. Somatic dysfunction of cervical region  4. Somatic dysfunction of thoracic region  5. Somatic dysfunction of lumbar region  6. Somatic dysfunction of pelvic region  7. Somatic dysfunction of rib region -OMT tolerated well per procedure note below - Start meloxicam 15 mg daily x2 weeks.  May use remaining as needed for pain control  Other orders - meloxicam (MOBIC) 15 MG tablet; Take 1 tablet (15 mg total) by mouth daily.  Procedure: Subacromial Injection Side: right  Risks explained and consent was given verbally. The site was cleaned with alcohol prep. A steroid injection was performed from posterior approach using 79m of 1% lidocaine without epinephrine and 161mof kenalog '40mg'$ /ml. This was well tolerated and resulted in symptomatic relief.  Needle was removed, hemostasis achieved, and post injection instructions were explained.   Pt was advised to call or return to clinic if these symptoms worsen or fail to improve as anticipated.  Decision today to treat with OMT was based on Physical Exam   After verbal consent patient was treated with HVLA, ME, ST, FPR techniques in cervical, rib, thoracic, lumbar, and pelvic areas. Patient tolerated the procedure well with improvement in symptoms.  Patient educated on potential side effects of soreness and recommended to rest, hydrate, and  use Tylenol as needed for pain control.   Pertinent previous records reviewed include previous sports med notes   Follow Up: As needed if no improvement or worsening of symptoms.  Could consider imaging versus formal PT at that time.   Subjective:   I, BrJudy Pimpleam serving as a scribe for Dr. BeGlennon MacChief Complaint: Neck and shoulder pain   HPI: [id number] old Male presenting with Left sided neck pain and R shoulder pian   12/22/20 Patient has seen Dr. CoGeorgina Snelln the past for right shoulder pain and was recently referred back to sports medicine by PCP for possible OMT for Left sided neck pian. Patients states that his right shoulder is still having some pain, is better than last visit. Patient locates neck pian to left side of neck that radiates up the base of skull. Patient is also having right sided neck pain but not as bad. Patient will wake up in the middle of the night with the pain and cannot get comfortable. Patient works at a desk and slouches and feels that is a contributing factor. Patient has seen a chiropractor in the past but the neck would be very painful during the manipulation.   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  Current Outpatient Medications  Medication Sig Dispense Refill   escitalopram (LEXAPRO) 20 MG tablet Take 1 tablet (20 mg total) by mouth daily. 90 tablet 3   meloxicam (MOBIC) 15 MG tablet Take 1 tablet (15 mg total) by mouth daily. 30 tablet 0  omeprazole (PRILOSEC) 20 MG capsule Take 20 mg by mouth daily.     LORazepam (ATIVAN) 1 MG tablet TAKE 1 TABLET (1 MG TOTAL) BY MOUTH 2 (TWO) TIMES DAILY AS NEEDED FOR ANXIETY (DO NOT DRIVE FOR 8 HOURS AFTER TAKING). (Patient not taking: Reported on 12/22/2020) 20 tablet 1   No current facility-administered medications for this visit.      Objective:     Vitals:   12/22/20 0758  BP: 110/70   Pulse: 60  SpO2: 96%  Weight: 210 lb (95.3 kg)  Height: '6\' 1"'$  (1.854 m)      Body mass index is 27.71 kg/m.    Physical Exam:     Cervical Spine: Posture normal;    Tenderness to palpation along SCM  Neck ROM: Decreased sidebending bilaterally  Neurological:  Deep Tendon Reflexes:   Right  Left   Braachioradialis 2+ 2+  Bicep 2+  2+    Strength   Right  Left   Bicep 5/5  5/5  Tricep 5/5 5/5  Grip 5/5 5/5  Deltoid 5/5 5/5   Sensation: intact to light touch Skin: normal, intact Spurling's:  negative bilaterally ROM: Full active ROM  OMT Physical Exam:  ASIS Compression Test: Positive Right Cervical: TTP paraspinal, left worse than right.  Decreased sidebending bilaterally Thoracic: NTTP paraspinal, RRSL T6-10 Lumbar: NTTT paraspinal, L1-3 RRSL Rib: Bilateral elevated first rib with TTP Pelvis: Right anterior innominate   OMT abbreviations: HVLA (high velocity low amplitude), ME (muscle energy), FPR (flex positional release), ST (soft tissue), TTP (tender to palpation),  Electronically signed by:  Thomas Mccreedy D.[facility] 9:07 AM 12/22/20

## 2020-12-22 NOTE — Patient Instructions (Addendum)
Good to see you  Right  Shoulder injection today  Meloxicam '15mg'$  daily for 3 weeks Shoulder exercises given try to do 3X a week  See me again as needed

## 2021-01-04 DIAGNOSIS — F419 Anxiety disorder, unspecified: Secondary | ICD-10-CM | POA: Diagnosis not present

## 2021-04-26 NOTE — Progress Notes (Signed)
Benito Mccreedy D.[place] Phone: [phone]   Assessment and Plan:     2. Chronic pain in right shoulder -Chronic with exacerbation, subsequent visit - Moderate improvement in symptoms after CSI at previous office visit, however pain has returned gradually over the past 1+ month - Start HEP for shoulder.  Offered PT, but patient declines at this time - If no improvement at 2-week office visit, would consider MRI of shoulder for further evaluation  1. Neck pain 3. Strain of neck muscle, subsequent encounter 4. Somatic dysfunction of cervical region 5. Somatic dysfunction of thoracic region 6. Somatic dysfunction of rib region -Chronic with exacerbation, subsequent visit - Recurrence of multiple musculoskeletal complaints with most prominent being in neck to occiput - No red flag symptoms, so no additional imaging at today's visit - Patient has received significant relief with OMT in the past.  Elects for repeat OMT today.  Tolerated well per note below. - Decision today to treat with OMT was based on Physical Exam  After verbal consent patient was treated with HVLA (high velocity low amplitude), ME (muscle energy), FPR (flex positional release), ST (soft tissue), PC/PD (Pelvic Compression/ Pelvic Decompression) techniques in cervical, rib, thoracic, lumbar, and pelvic areas. Patient tolerated the procedure well with improvement in symptoms.  Patient educated on potential side effects of soreness and recommended to rest, hydrate, and use Tylenol as needed for pain control.    Pertinent previous records reviewed include none   Follow Up: Follow-up in 2 weeks for reevaluation, repeat OMT.  Was unable to have articulation into spine likely due to muscle tightness at this visit.  We will also recheck shoulder.  If no improvement or worsening of symptoms, would consider shoulder MRI.   Subjective:   I, Moenique  Idelia Salm, am serving as a Education administrator for Doctor Glennon Mac  Chief Complaint: neck and shoulder pain   HPI:   12/22/20 Patient has seen Dr. Georgina Snell in the past for right shoulder pain and was recently referred back to sports medicine by PCP for possible OMT for Left sided neck pian. Patients states that his right shoulder is still having some pain, is better than last visit. Patient locates neck pian to left side of neck that radiates up the base of skull. Patient is also having right sided neck pain but not as bad. Patient will wake up in the middle of the night with the pain and cannot get comfortable. Patient works at a desk and slouches and feels that is a contributing factor. Patient has seen a chiropractor in the past but the neck would be very painful during the manipulation.    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   Current Outpatient Medications:    escitalopram (LEXAPRO) 20 MG tablet, Take 1 tablet (20 mg total) by mouth daily., Disp: 90 tablet, Rfl: 3   LORazepam (ATIVAN) 1 MG tablet, TAKE 1 TABLET (1 MG  TOTAL) BY MOUTH 2 (TWO) TIMES DAILY AS NEEDED FOR ANXIETY (DO NOT DRIVE FOR 8 HOURS AFTER TAKING)., Disp: 20 tablet, Rfl: 1   meloxicam (MOBIC) 15 MG tablet, Take 1 tablet (15 mg total) by mouth daily., Disp: 30 tablet, Rfl: 0   omeprazole (PRILOSEC) 20 MG capsule, Take 20 mg by mouth daily., Disp: , Rfl:    Objective:     Vitals:   04/27/21 0844  BP: 132/80  Pulse: 71  SpO2: 97%  Weight: 217  lb (98.4 kg)  Height: 6\' 1"  (1.854 m)      Body mass index is 28.63 kg/m.    Physical Exam:    Gen: Appears well, nad, nontoxic and pleasant Neuro:sensation intact, strength is 5/5 with df/pf/inv/ev, muscle tone wnl Skin: no suspicious lesion or defmority Psych: A&O, appropriate mood and affect  Right shoulder: no deformity, swelling or muscle wasting No scapular winging FF 180, abd 160, int 15, ext 80 NTTP over the [place], clavicle, ac, coracoid, biceps groove, humerus, deltoid, trapezius, cervical spine Positive neer, hawkings, empty can,  obriens,   Negative subscap lift off, crossarm, speeds Neg ant drawer, sulcus sign, apprehension Negative Spurling's test bilat FROM of neck   General: Well-appearing, cooperative, sitting comfortably in no acute distress.   OMT Physical Exam:   Cervical: TTP paraspinal, C3-6 RRSR Rib: Bilateral elevated first rib with TTP, worse on left Thoracic: TTP paraspinal, T4-7 RRSL    Electronically signed by:  Benito Mccreedy D.[facility] 9:28 AM 04/27/21

## 2021-04-27 ENCOUNTER — Ambulatory Visit: Payer: BC Managed Care – PPO | Admitting: Sports Medicine

## 2021-04-27 ENCOUNTER — Other Ambulatory Visit: Payer: Self-pay

## 2021-04-27 VITALS — BP 132/80 | HR 71 | Ht 73.0 in | Wt 217.0 lb

## 2021-04-27 DIAGNOSIS — M9902 Segmental and somatic dysfunction of thoracic region: Secondary | ICD-10-CM

## 2021-04-27 DIAGNOSIS — M9901 Segmental and somatic dysfunction of cervical region: Secondary | ICD-10-CM | POA: Diagnosis not present

## 2021-04-27 DIAGNOSIS — M25511 Pain in right shoulder: Secondary | ICD-10-CM | POA: Diagnosis not present

## 2021-04-27 DIAGNOSIS — M9908 Segmental and somatic dysfunction of rib cage: Secondary | ICD-10-CM

## 2021-04-27 DIAGNOSIS — M542 Cervicalgia: Secondary | ICD-10-CM | POA: Diagnosis not present

## 2021-04-27 DIAGNOSIS — S161XXD Strain of muscle, fascia and tendon at neck level, subsequent encounter: Secondary | ICD-10-CM

## 2021-04-27 DIAGNOSIS — G8929 Other chronic pain: Secondary | ICD-10-CM

## 2021-04-27 NOTE — Patient Instructions (Addendum)
Good to see you  Shoulder HEP  2 week follow up

## 2021-05-10 NOTE — Progress Notes (Signed)
Benito Mccreedy D.[place] Phone: [phone]   Assessment and Plan:     1. Chronic pain in right shoulder -Chronic with exacerbation, subsequent visit - Continued improvement in symptoms with HEP, though still experiencing intermittent pain - Continue HEP - Start meloxicam 15 mg daily x2 weeks.  If still having pain after 2 weeks, complete 3rd-week of meloxicam. May use remaining meloxicam as needed once daily for pain control.  Do not to use additional NSAIDs while taking meloxicam.  May use Tylenol [phone] mg to 3 times a day for breakthrough pain.   2. Neck pain 3. Somatic dysfunction of cervical region 4. Somatic dysfunction of thoracic region 5. Somatic dysfunction of rib region -Chronic with exacerbation, subsequent visit - Recurrence of multiple musculoskeletal complaints with most prominent being in neck right side .Meloxicam - Patient has received significant relief with OMT in the past.  Elects for repeat OMT today.  Tolerated well per note below. - Decision today to treat with OMT was based on Physical Exam   After verbal consent patient was treated with HVLA (high velocity low amplitude), ME (muscle energy), FPR (flex positional release), ST (soft tissue), PC/PD (Pelvic Compression/ Pelvic Decompression) techniques in cervical, rib, thoracic, areas. Patient tolerated the procedure well with improvement in symptoms.  Patient educated on potential side effects of soreness and recommended to rest, hydrate, and use Tylenol as needed for pain control.   Pertinent previous records reviewed include none   Follow Up: 3 to 4 weeks for reevaluation.  Could consider repeat OMT versus advanced imaging for shoulder or x-ray for cervical spine if no improvement   Subjective:   I, Thomas Duran, am serving as a Education administrator for Doctor Glennon Mac  Chief Complaint: neck and shoulder pain   HPI:   12/22/20 Patient has seen Dr. Georgina Snell in the past for right shoulder pain and was recently referred back to sports medicine by PCP for possible OMT for Left sided neck pian. Patients states that his right shoulder is still having some pain, is better than last visit. Patient locates neck pian to left side of neck that radiates up the base of skull. Patient is also having right sided neck pain but not as bad. Patient will wake up in the middle of the night with the pain and cannot get comfortable. Patient works at a desk and slouches and feels that is a contributing factor. Patient has seen a chiropractor in the past but the neck would be very painful during the manipulation.    04/27/2021 Patient states that he has a persistent neck pain , shoulder still bothers a little pain is at the base of his skull occipital  it feels like a tight muscle but he cant stretch it been going on for several months very uncomfortable been heating , got a massage painful but in a good way neck pain didn't go away, when he abducts he gets a little tingle on the right side     05/11/2021 Patient states that he is improving has been trying to the HEP and those seem to be helping has been increasing some ROM in neck and shoulder , shoulder feels better , still feels a little something there but it is the best its felt in a while .    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  Current Outpatient Medications  Medication Sig  Dispense Refill   escitalopram (LEXAPRO) 20 MG tablet Take 1 tablet (20 mg total) by mouth daily. 90 tablet 3   LORazepam (ATIVAN) 1 MG tablet TAKE 1 TABLET (1 MG TOTAL) BY MOUTH 2 (TWO) TIMES DAILY AS NEEDED FOR ANXIETY (DO NOT DRIVE FOR 8 HOURS AFTER TAKING). 20 tablet 1   meloxicam (MOBIC) 15 MG tablet Take 1 tablet (15 mg total) by mouth daily. 30 tablet 0   omeprazole (PRILOSEC) 20 MG capsule Take  20 mg by mouth daily.     No current facility-administered medications for this visit.      Objective:     Vitals:   05/11/21 0812  BP: 122/72  Pulse: (!) 58  SpO2: 97%  Weight: 219 lb (99.3 kg)  Height: 6\' 1"  (1.854 m)      Body mass index is 28.89 kg/m.    Physical Exam:     General: Well-appearing, cooperative, sitting comfortably in no acute distress.   OMT Physical Exam:  ASIS Compression Test: Positive Right Cervical: TTP paraspinal, C3-5 RRSR Rib: Bilateral elevated first rib with TTP Thoracic: TTP paraspinal, T3-6 RRSL  Gen: Appears well, nad, nontoxic and pleasant Neuro:sensation intact, strength is 5/5 with df/pf/inv/ev, muscle tone wnl Skin: no suspicious lesion or defmority Psych: A&O, appropriate mood and affect  Right shoulder: no deformity, swelling or muscle wasting No scapular winging FF 180, abd 180, int 0, ext 90 NTTP over the [place], clavicle, ac, coracoid, biceps groove, humerus, deltoid, trapezius, cervical spine Positive [place] Neg neer,  , empty can, subscap liftoff, speeds, obriens, crossarm Neg ant drawer, sulcus sign, apprehension Negative Spurling's test bilat FROM of neck   Electronically signed by:  Benito Mccreedy D.[facility] 8:39 AM 05/11/21

## 2021-05-11 ENCOUNTER — Other Ambulatory Visit: Payer: Self-pay

## 2021-05-11 ENCOUNTER — Ambulatory Visit: Payer: BC Managed Care – PPO | Admitting: Sports Medicine

## 2021-05-11 VITALS — BP 122/72 | HR 58 | Ht 73.0 in | Wt 219.0 lb

## 2021-05-11 DIAGNOSIS — G8929 Other chronic pain: Secondary | ICD-10-CM

## 2021-05-11 DIAGNOSIS — M9908 Segmental and somatic dysfunction of rib cage: Secondary | ICD-10-CM

## 2021-05-11 DIAGNOSIS — M25511 Pain in right shoulder: Secondary | ICD-10-CM | POA: Diagnosis not present

## 2021-05-11 DIAGNOSIS — M9901 Segmental and somatic dysfunction of cervical region: Secondary | ICD-10-CM | POA: Diagnosis not present

## 2021-05-11 DIAGNOSIS — M9902 Segmental and somatic dysfunction of thoracic region: Secondary | ICD-10-CM

## 2021-05-11 DIAGNOSIS — M542 Cervicalgia: Secondary | ICD-10-CM | POA: Diagnosis not present

## 2021-05-11 MED ORDER — MELOXICAM 15 MG PO TABS: 15.0000 mg | ORAL_TABLET | Freq: Every day | ORAL | 0 refills | Status: DC

## 2021-05-11 NOTE — Patient Instructions (Addendum)
Good to see you  Start meloxicam 15 mg daily x2 weeks.  If still having pain after 2 weeks, complete 3rd-week of meloxicam. May use remaining meloxicam as needed once daily for pain control Do not to use additional NSAIDs while taking meloxicam May use Tylenol [phone] mg to 3 times a day for breakthrough pain.  Continue shoulder and neck stretching and home exercise program Follow-up in 3 to 4 weeks for reevaluation

## 2021-06-01 ENCOUNTER — Ambulatory Visit: Payer: BC Managed Care – PPO | Admitting: Sports Medicine

## 2021-06-07 ENCOUNTER — Other Ambulatory Visit: Payer: Self-pay | Admitting: Sports Medicine

## 2021-06-07 DIAGNOSIS — M542 Cervicalgia: Secondary | ICD-10-CM

## 2021-06-07 DIAGNOSIS — M25511 Pain in right shoulder: Secondary | ICD-10-CM

## 2021-06-07 DIAGNOSIS — M9901 Segmental and somatic dysfunction of cervical region: Secondary | ICD-10-CM

## 2021-06-07 DIAGNOSIS — M9908 Segmental and somatic dysfunction of rib cage: Secondary | ICD-10-CM

## 2021-06-07 DIAGNOSIS — M9902 Segmental and somatic dysfunction of thoracic region: Secondary | ICD-10-CM

## 2021-06-07 DIAGNOSIS — G8929 Other chronic pain: Secondary | ICD-10-CM

## 2021-06-20 NOTE — Progress Notes (Incomplete)
° °  Phone [phone] In person visit   Subjective:   Thomas Duran is a 46 y.o. year old very pleasant male patient who presents for/with See problem oriented charting No chief complaint on file.   This visit occurred during the SARS-CoV-2 public health emergency.  Safety protocols were in place, including screening questions prior to the visit, additional usage of staff PPE, and extensive cleaning of exam room while observing appropriate contact time as indicated for disinfecting solutions.   Past Medical History-  Patient Active Problem List   Diagnosis Date Noted   Major depression in full remission ([place]) 02/12/2017   Chronic cough 06/29/2014   Hyperlipidemia 03/14/2014   OSA (obstructive sleep apnea) 11/08/2011   MRSA infection 03/29/2011   GAD (generalized anxiety disorder) 02/13/2011   Gastroesophageal reflux disease 02/13/2011   Hypogonadism male 02/13/2011    Medications- reviewed and updated Current Outpatient Medications  Medication Sig Dispense Refill   escitalopram (LEXAPRO) 20 MG tablet Take 1 tablet (20 mg total) by mouth daily. 90 tablet 3   LORazepam (ATIVAN) 1 MG tablet TAKE 1 TABLET (1 MG TOTAL) BY MOUTH 2 (TWO) TIMES DAILY AS NEEDED FOR ANXIETY (DO NOT DRIVE FOR 8 HOURS AFTER TAKING). 20 tablet 1   meloxicam (MOBIC) 15 MG tablet Take 1 tablet (15 mg total) by mouth daily. 30 tablet 0   omeprazole (PRILOSEC) 20 MG capsule Take 20 mg by mouth daily.     No current facility-administered medications for this visit.     Objective:  There were no vitals taken for this visit. Gen: NAD, resting comfortably CV: RRR no murmurs rubs or gallops Lungs: CTAB no crackles, wheeze, rhonchi Abdomen: soft/nontender/nondistended/normal bowel sounds. No rebound or guarding.  Ext: no edema Skin: warm, dry Neuro: grossly normal, moves all extremities  ***    Assessment and Plan   cpe 07/21/20 ***  #hyperlipidemia S: Medication: none Lab Results  Component Value  Date   CHOL 247 (H) 07/21/2020   HDL 34.50 (L) 07/21/2020   LDLCALC 180 (H) 07/21/2020   TRIG 161.0 (H) 07/21/2020   CHOLHDL 7 07/21/2020   A/P: ***  #OSA- compliant wth CPAP  # GERD S:Medication: Nexium '40Mg'$  then omeprazole 20 mg now OTC omeprazole 20 mg  - see note from 2021- patient initially placed on omeprazole with some diffiulty swallowing, lack of exertional chest pain. Had reassuring CXR for slight shortness of breath. Plan was referred to GI if symptoms worsened. Later changed to nexium (perhaps due to insurance) B12 levels related to PPI use: Lab Results  Component Value Date   VITAMINB12 416 07/21/2020   A/P: ***   # Depression/GAD S: Medication: lexapro 20 mg in past- weaned off in 2022 but with later significant recurrence of symptoms and had to restart A/P: ***   Recommended follow up: No follow-ups on file. Future Appointments  Date Time Provider [place]  07/18/2021  8:00 AM Marin Olp, MD [facility]    Lab/Order associations: No diagnosis found.  No orders of the defined types were placed in this encounter.   Return precautions advised.  Burnett Corrente

## 2021-07-18 ENCOUNTER — Encounter: Payer: Self-pay | Admitting: Family Medicine

## 2021-07-18 ENCOUNTER — Ambulatory Visit: Payer: BC Managed Care – PPO | Admitting: Family Medicine

## 2021-07-18 VITALS — BP 110/80 | HR 64 | Ht 73.0 in | Wt 217.2 lb

## 2021-07-18 DIAGNOSIS — F325 Major depressive disorder, single episode, in full remission: Secondary | ICD-10-CM | POA: Diagnosis not present

## 2021-07-18 DIAGNOSIS — G4733 Obstructive sleep apnea (adult) (pediatric): Secondary | ICD-10-CM

## 2021-07-18 DIAGNOSIS — K219 Gastro-esophageal reflux disease without esophagitis: Secondary | ICD-10-CM

## 2021-07-18 DIAGNOSIS — F411 Generalized anxiety disorder: Secondary | ICD-10-CM

## 2021-07-18 DIAGNOSIS — E785 Hyperlipidemia, unspecified: Secondary | ICD-10-CM

## 2021-07-18 MED ORDER — CYCLOBENZAPRINE HCL 5 MG PO TABS
5.0000 mg | ORAL_TABLET | Freq: Every evening | ORAL | 1 refills | Status: DC | PRN
Start: 2021-07-18 — End: 2021-11-26

## 2021-07-18 MED ORDER — LORAZEPAM 1 MG PO TABS: 1.0000 mg | ORAL_TABLET | Freq: Two times a day (BID) | ORAL | 1 refills | Status: DC | PRN

## 2021-07-18 NOTE — Patient Instructions (Addendum)
Depression is well controlled.  Anxiety is well controlled except still with some nighttime anxiety-we opted to try Ativan before bed before upcoming stressful days or after particularly stressful days perhaps once or twice a week.  Also could be a side effect of Lexapro and we discussed possibly going down to 10 mg if does not see improvement with the Ativan.  Ativan was refilled today and recommended scheduling a physical in 4 to 6 months to check back in ? ?Also could try flexeril as alternate but not together with ativan to see if helps with neck tension ? ?Recommended follow up: Return in about 4 months (around 11/17/2021) for physical or sooner if needed.Schedule b4 you leave. ?

## 2021-07-27 ENCOUNTER — Encounter: Payer: BC Managed Care – PPO | Admitting: Family Medicine

## 2021-10-09 IMAGING — DX DG SHOULDER 2+V*R*
3 series · 3 of 3 positions shown · non-contrast
Comparison: None.

CLINICAL DATA: Pain

EXAM:
RIGHT SHOULDER - 2+ VIEW

[shoulder ap (1 of 2)]
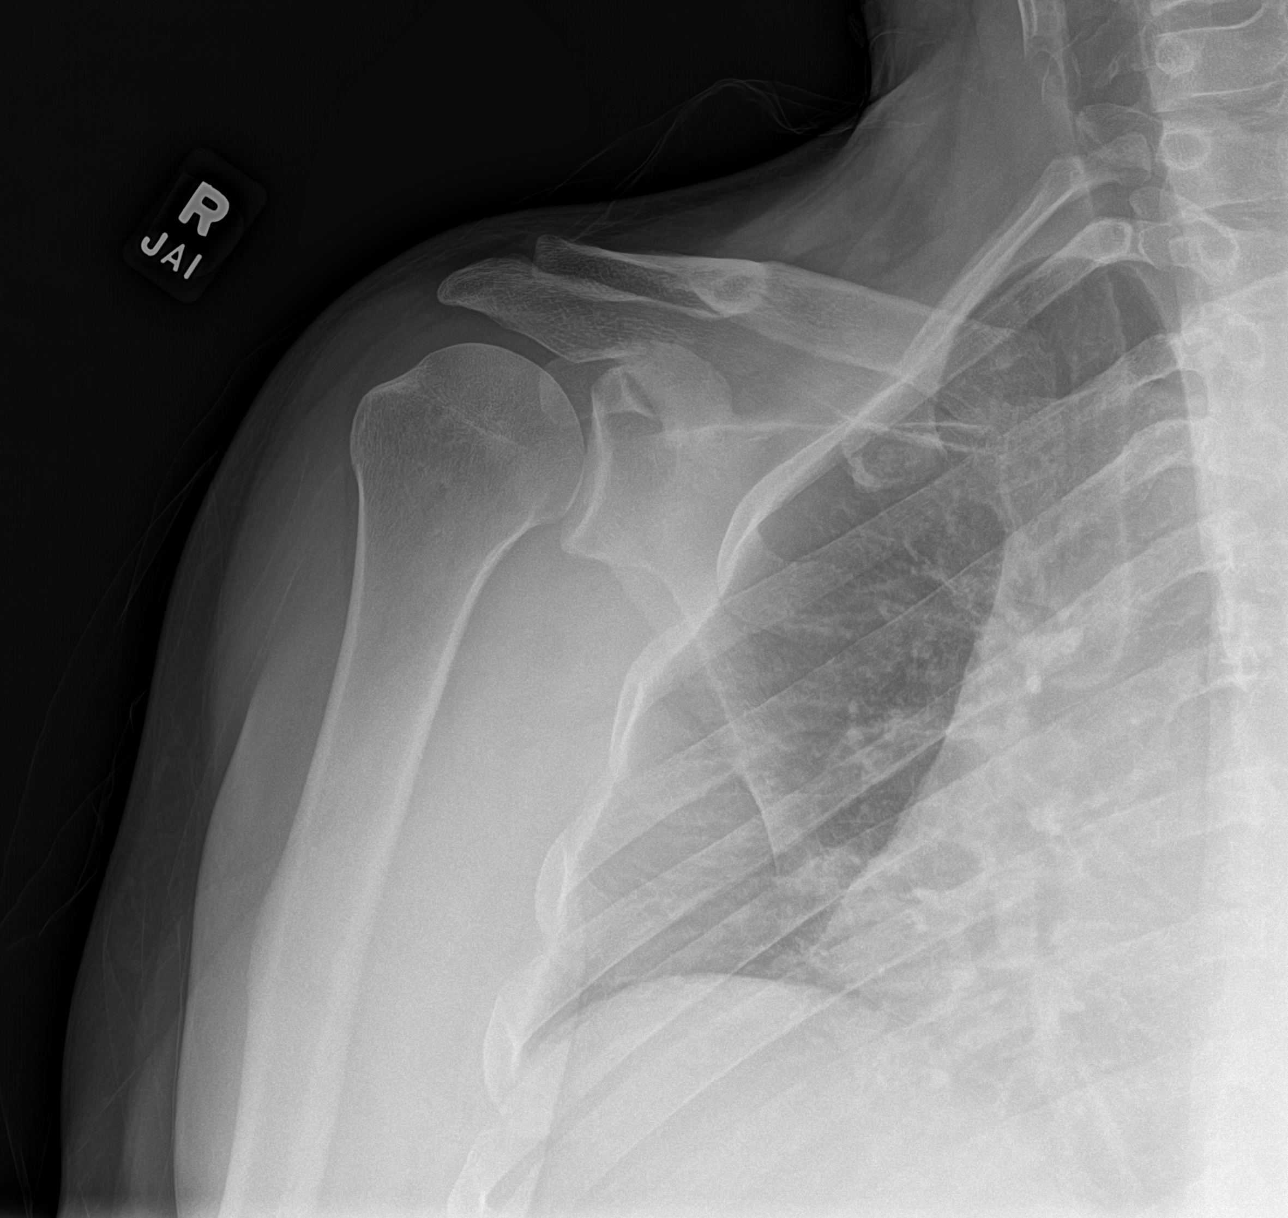

[shoulder ap (2 of 2)]
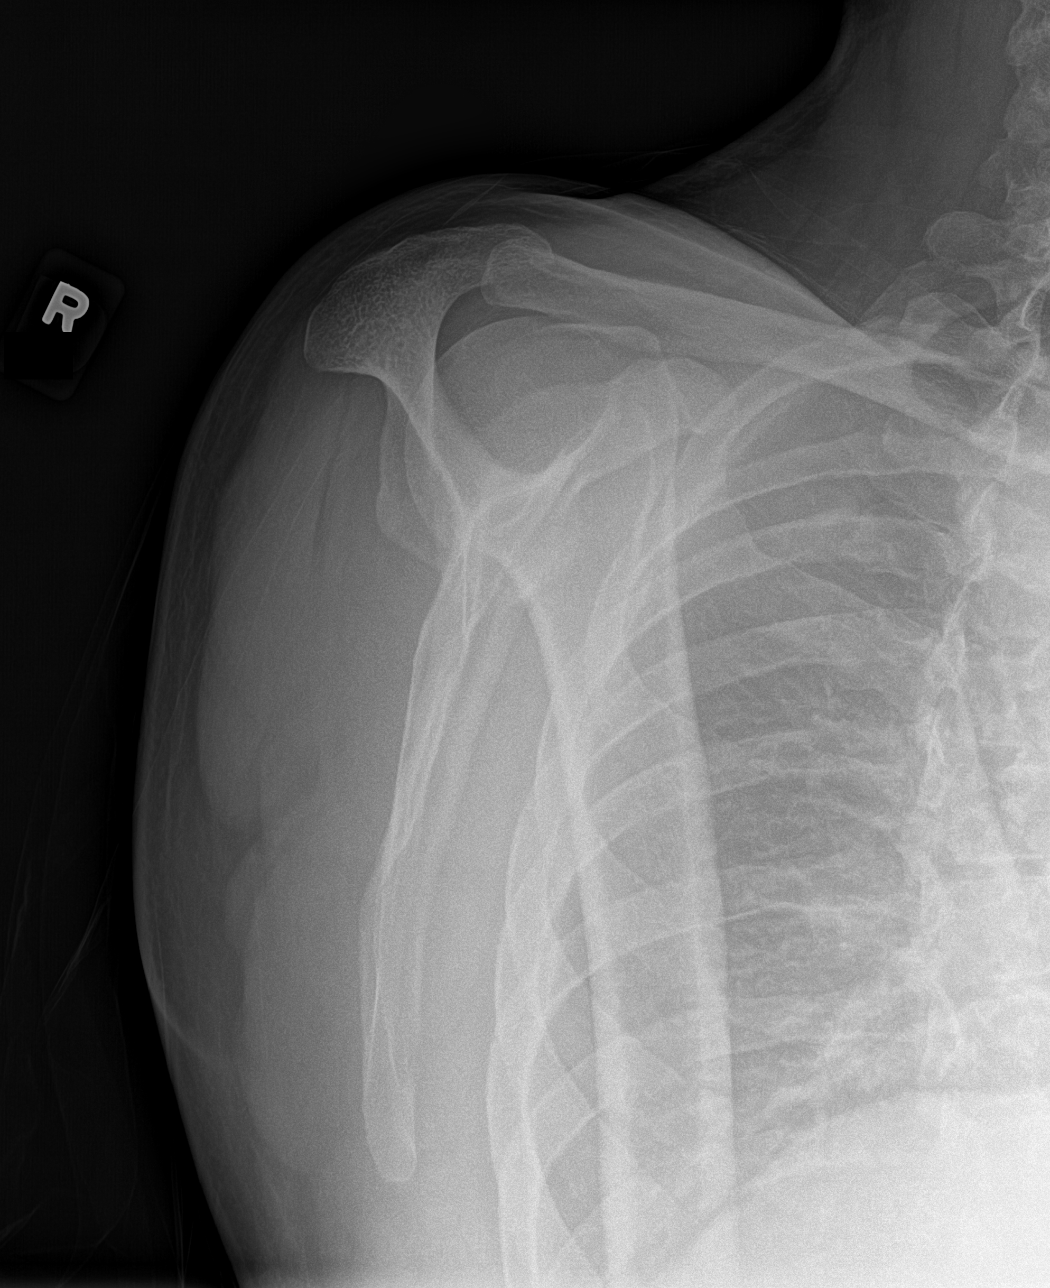

[shoulder axial]
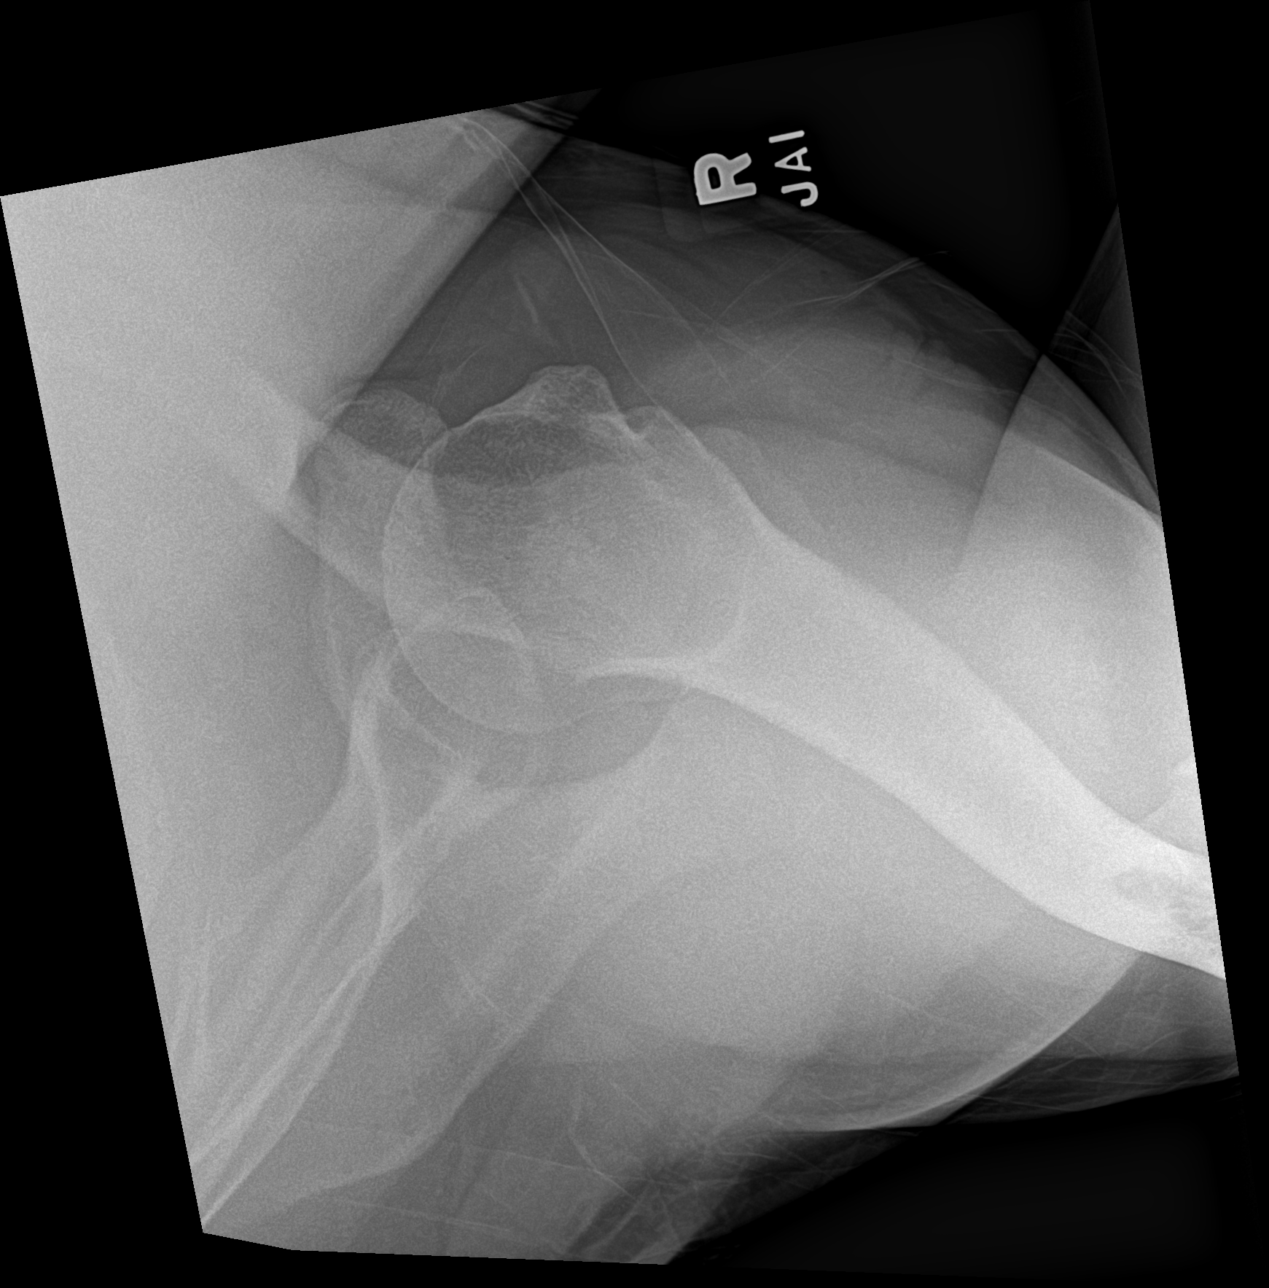

[3 of 3 positions shown; findings below may reference images not displayed]

FINDINGS: Oblique, Y scapular, and axillary images were obtained. There is no
acute fracture. On the oblique view, there is an apparent Hill-Sachs
defect. No dislocation evident. Joint spaces appear normal. No
erosive change. Visualized right lung clear.
IMPRESSION: Apparent Hill-Sachs defect suggesting prior anterior dislocation. No
acute fracture or dislocation. No underlying arthropathy.

## 2021-11-26 ENCOUNTER — Encounter: Payer: Self-pay | Admitting: Family Medicine

## 2021-11-26 ENCOUNTER — Ambulatory Visit (INDEPENDENT_AMBULATORY_CARE_PROVIDER_SITE_OTHER): Payer: BC Managed Care – PPO | Admitting: Family Medicine

## 2021-11-26 VITALS — BP 110/72 | HR 51 | Temp 98.3°F | Ht 73.0 in | Wt 222.8 lb

## 2021-11-26 DIAGNOSIS — Z1211 Encounter for screening for malignant neoplasm of colon: Secondary | ICD-10-CM | POA: Diagnosis not present

## 2021-11-26 DIAGNOSIS — Z79899 Other long term (current) drug therapy: Secondary | ICD-10-CM | POA: Diagnosis not present

## 2021-11-26 DIAGNOSIS — Z0000 Encounter for general adult medical examination without abnormal findings: Secondary | ICD-10-CM

## 2021-11-26 DIAGNOSIS — E785 Hyperlipidemia, unspecified: Secondary | ICD-10-CM | POA: Diagnosis not present

## 2021-11-26 MED ORDER — CYCLOBENZAPRINE HCL 5 MG PO TABS: 5.0000 mg | ORAL_TABLET | Freq: Every evening | ORAL | 1 refills | Status: DC | PRN

## 2021-11-26 MED ORDER — ESCITALOPRAM OXALATE 20 MG PO TABS: 20.0000 mg | ORAL_TABLET | Freq: Every day | ORAL | 3 refills | Status: DC

## 2021-11-26 NOTE — Patient Instructions (Addendum)
Schedule a lab visit at the check out desk within 2 weeks. Return for future fasting labs meaning nothing but water after midnight please. Ok to take your medications with water.   [facility] contact- can call after turning 46 or now and schedule for after age 45 Please call to schedule visit and/or procedure Address: [address], [place], [place] [address] Phone: [phone]   Recommended follow up: Return in about 1 year (around 11/27/2022) for physical or sooner if needed.Schedule b4 you leave.

## 2021-11-26 NOTE — Progress Notes (Signed)
Phone: [phone]    Subjective:  Patient presents today for their annual physical. Chief complaint-noted.   See problem oriented charting- ROS- full  review of systems was completed and negative  Per full ROS sheet completed by patient- does mention some fatigue and poor sleep quality- in bed reasonable hours- last week  The following were reviewed and entered/updated in epic: Past Medical History:  Diagnosis Date   Generalized anxiety disorder    GERD (gastroesophageal reflux disease)    Hypogonadism in male    in past - Managed by endo - Dr. Buddy Duty. self weaned from meds and no signifcant change in symptoms.    Patient Active Problem List   Diagnosis Date Noted   Major depression in full remission ([place]) 02/12/2017    Priority: High   Hyperlipidemia 03/14/2014    Priority: Medium    OSA (obstructive sleep apnea) 11/08/2011    Priority: Medium    GAD (generalized anxiety disorder) 02/13/2011    Priority: Medium    Gastroesophageal reflux disease 02/13/2011    Priority: Medium    Hypogonadism male 02/13/2011    Priority: Medium    Chronic cough 06/29/2014    Priority: Low   MRSA infection 03/29/2011    Priority: Low   Past Surgical History:  Procedure Laterality Date   WISDOM TOOTH EXTRACTION      Family History  Problem Relation Age of Onset   Schizophrenia Brother        died 68 - suicide. diagnosis was unclear- brother would not get help   Dementia Maternal Grandfather    Healthy Mother    Arthritis Father    Anxiety disorder Sister    Cancer Paternal Grandmother        unknown= in bones   Emphysema Paternal Grandfather    Heart Problems Paternal Grandfather        pacemaker    Medications- reviewed and updated Current Outpatient Medications  Medication Sig Dispense Refill   omeprazole (PRILOSEC) 20 MG capsule Take 20 mg by mouth daily.     cyclobenzaprine (FLEXERIL) 5 MG tablet Take 1 tablet (5 mg total) by mouth at bedtime as needed for muscle  spasms (in neck. do not drive for 8 hours after taking). 30 tablet 1   escitalopram (LEXAPRO) 20 MG tablet Take 1 tablet (20 mg total) by mouth daily. 90 tablet 3   No current facility-administered medications for this visit.    Allergies-reviewed and updated No Known Allergies  Social History   Social History Narrative   Married. 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 Oropharynx normal Neck: no thyromegaly CV: RRR no murmurs rubs or gallops Lungs: CTAB no crackles, wheeze, rhonchi Abdomen: soft/nontender/nondistended/normal bowel sounds. No rebound or guarding.  Ext: no edema Skin: warm, dry Neuro: grossly normal, moves all extremities, PERRLA  Assessment and Plan:  46 y.o. male presenting for annual physical.  Health Maintenance counseling: 1. Anticipatory guidance: Patient counseled regarding regular dental exams -q6 months, eye exams -no vision issues,  avoiding smoking and second hand smoke , limiting alcohol to 2 beverages per day- rare social, no illicit drugs.   2. Risk factor reduction:  Advised patient of need for regular exercise and diet rich and fruits and vegetables to reduce risk of heart attack and stroke.  Exercise- walking regularly 4x a week.  Diet/weight management- wt within 2 lbs of last year- feels portion size is the main barrier Wt Readings from Last 3 Encounters:  11/26/21 222 lb 12.8 oz (101.1 kg)  07/18/21 217 lb 3.2 oz (98.5 kg)  05/11/21 219 lb  (99.3 kg)  3. Immunizations/screenings/ancillary studies- declines flu and covid shots this season  Immunization History  Administered Date(s) Administered   Tdap 10/31/2014   4. Prostate cancer screening-  low risk initial psa Lab Results  Component Value Date   PSA 0.21 06/25/2018   5. Colon cancer screening - no family history, start at age 16. Stool cards negative 3 years ago despite hemorrhoids. Will refer as turns 46 within 6 weeks 6. Skin cancer screening/prevention- derm 3 years ago or so. advised regular sunscreen use. Denies worrisome, changing, or new skin lesions.  7. Testicular cancer screening- advised monthly self exams  8. STD screening- patient opts out- only active with wife 64. Smoking associated screening- Never smoker  Status of chronic or acute concerns   #hyperlipidemia S: Medication: none Lab Results  Component Value Date   CHOL 247 (H) 07/21/2020   HDL 34.50 (L) 07/21/2020   LDLCALC 180 (H) 07/21/2020   TRIG 161.0 (H) 07/21/2020   CHOLHDL 7 07/21/2020   A/P: The 10-year ASCVD risk score (Arnett DK, et al., 2019) is: 3.2%. below threshold for meds or further workup- continue to work on diet/exercise/weight loss unless #s elevate  #OSA- compliant wth CPAP  # GERD S:Medication: Nexium '40Mg'$  then omeprazole 20 mg now OTC omeprazole 20 mg Lab Results  Component Value Date   VITAMINB12 416 07/21/2020  A/P: Controlled. Continue current medications.  Check b12 with long term PPI use   # Depression/GAD S: Medication: lexapro 20 mg in past- weaned off in 2022 but with later significant recurrence of symptoms and had to restart. Not using lorazepam since around time of last visit - counseling: has not needed further visits with Dr. Frederico Hamman in last 2 visits here -reports anxiety well controlled    11/26/2021    8:21 AM 07/18/2021    7:59 AM 12/15/2020    8:18 AM  Depression screen PHQ 2/9  Decreased Interest 0 0 1  Down, Depressed, Hopeless 0 0 0  PHQ - 2 Score  0 0 1  Altered sleeping 0 2 3  Tired, decreased energy 0 0 3  Change in appetite 0 0 0  Feeling bad or failure about yourself  0 0 0  Trouble concentrating 0 0 0  Moving slowly or fidgety/restless 0 0 0  Suicidal thoughts 0 0 0  PHQ-9 Score 0 2 7  Difficult doing work/chores Not difficult at all Not difficult at all Not difficult at all  A/P: depression and GAD well controlled- continue current meds   #neck and back tension- sparing flexeril and helps him sleep due to these  Recommended follow up: Return in about 1 year (around 11/27/2022) for physical or sooner if needed.Schedule b4 you leave.  Lab/Order  associations: will come back fasting   ICD-10-CM   1. Preventative health care  Z00.00 Comprehensive metabolic panel    CBC with Differential/Platelet    Lipid panel    Vitamin B12    Ambulatory referral to Gastroenterology    2. Screen for colon cancer  Z12.11 Ambulatory referral to Gastroenterology    3. Hyperlipidemia, unspecified hyperlipidemia type  E78.5 Comprehensive metabolic panel    CBC with Differential/Platelet    Lipid panel    4. High risk medication use  Z79.899 Vitamin B12      Meds ordered this encounter  Medications   escitalopram (LEXAPRO) 20 MG tablet    Sig: Take 1 tablet (20 mg total) by mouth daily.    Dispense:  90 tablet    Refill:  3   cyclobenzaprine (FLEXERIL) 5 MG tablet    Sig: Take 1 tablet (5 mg total) by mouth at bedtime as needed for muscle spasms (in neck. do not drive for 8 hours after taking).    Dispense:  30 tablet    Refill:  1    Return precautions advised.   Garret Reddish, MD

## 2021-12-12 ENCOUNTER — Other Ambulatory Visit (INDEPENDENT_AMBULATORY_CARE_PROVIDER_SITE_OTHER): Payer: BC Managed Care – PPO

## 2021-12-12 DIAGNOSIS — E785 Hyperlipidemia, unspecified: Secondary | ICD-10-CM

## 2021-12-12 DIAGNOSIS — Z79899 Other long term (current) drug therapy: Secondary | ICD-10-CM | POA: Diagnosis not present

## 2021-12-12 DIAGNOSIS — Z0000 Encounter for general adult medical examination without abnormal findings: Secondary | ICD-10-CM | POA: Diagnosis not present

## 2021-12-12 LAB — CBC WITH DIFFERENTIAL/PLATELET
Basophils Absolute: 0 10*3/uL (ref 0.0–0.1)
Basophils Relative: 0.8 % (ref 0.0–3.0)
Eosinophils Absolute: 0.2 10*3/uL (ref 0.0–0.7)
Eosinophils Relative: 3.1 % (ref 0.0–5.0)
HCT: 43.9 % (ref 39.0–52.0)
Hemoglobin: 14.6 g/dL (ref 13.0–17.0)
Lymphocytes Relative: 37.7 % (ref 12.0–46.0)
Lymphs Abs: 2.3 10*3/uL (ref 0.7–4.0)
MCHC: 33.3 g/dL (ref 30.0–36.0)
MCV: 88.5 fl (ref 78.0–100.0)
Monocytes Absolute: 0.6 10*3/uL (ref 0.1–1.0)
Monocytes Relative: 9.2 % (ref 3.0–12.0)
Neutro Abs: 3.1 10*3/uL (ref 1.4–7.7)
Neutrophils Relative %: 49.2 % (ref 43.0–77.0)
Platelets: 211 10*3/uL (ref 150.0–400.0)
RBC: 4.96 Mil/uL (ref 4.22–5.81)
RDW: 13.4 % (ref 11.5–15.5)
WBC: 6.2 10*3/uL (ref 4.0–10.5)

## 2021-12-12 LAB — VITAMIN B12: Vitamin B-12: 244 pg/mL (ref 211–911)

## 2021-12-12 LAB — LIPID PANEL
Cholesterol: 279 mg/dL — ABNORMAL HIGH (ref 0–200)
HDL: 38.7 mg/dL — ABNORMAL LOW (ref >39.00)
LDL Cholesterol: 211 mg/dL — ABNORMAL HIGH (ref 0–99)
NonHDL: 240.69
Total CHOL/HDL Ratio: 7
Triglycerides: 147 mg/dL (ref 0.0–149.0)
VLDL: 29.4 mg/dL (ref 0.0–40.0)

## 2021-12-12 LAB — COMPREHENSIVE METABOLIC PANEL
ALT: 34 U/L (ref 0–53)
AST: 18 U/L (ref 0–37)
Albumin: 4.7 g/dL (ref 3.5–5.2)
Alkaline Phosphatase: 41 U/L (ref 39–117)
BUN: 16 mg/dL (ref 6–23)
CO2: 27 mEq/L (ref 19–32)
Calcium: 9.8 mg/dL (ref 8.4–10.5)
Chloride: 103 mEq/L (ref 96–112)
Creatinine, Ser: 0.99 mg/dL (ref 0.40–1.50)
GFR: 92.37 mL/min (ref >60.00)
Glucose, Bld: 64 mg/dL — ABNORMAL LOW (ref 70–99)
Potassium: 3.9 mEq/L (ref 3.5–5.1)
Sodium: 141 mEq/L (ref 135–145)
Total Bilirubin: 0.6 mg/dL (ref 0.2–1.2)
Total Protein: 7.5 g/dL (ref 6.0–8.3)

## 2021-12-18 ENCOUNTER — Other Ambulatory Visit: Payer: Self-pay

## 2021-12-18 DIAGNOSIS — E785 Hyperlipidemia, unspecified: Secondary | ICD-10-CM

## 2022-01-18 ENCOUNTER — Encounter: Payer: Self-pay | Admitting: Internal Medicine

## 2022-01-18 ENCOUNTER — Ambulatory Visit (AMBULATORY_SURGERY_CENTER): Payer: BC Managed Care – PPO

## 2022-01-18 VITALS — Ht 73.0 in | Wt 210.0 lb

## 2022-01-18 DIAGNOSIS — Z1211 Encounter for screening for malignant neoplasm of colon: Secondary | ICD-10-CM

## 2022-01-18 MED ORDER — NA SULFATE-K SULFATE-MG SULF 17.5-3.13-1.6 GM/177ML PO SOLN: 1.0000 | ORAL | 0 refills | Status: DC

## 2022-01-18 NOTE — Progress Notes (Signed)
No egg or soy allergy known to patient  No issues known to pt with past sedation with any surgeries or procedures Pt is not on diet pills Pt is not on  home 02  Pt is not on blood thinners  Pt denies issues with constipation  No A fib or A flutter Have any cardiac testing pending--denied Pt instructed to use [email or web address] or GoodRx for a price reduction on prep   PV conducted over phone. Suprep instructions sent via MyChart and mailed to verified address.

## 2022-01-20 ENCOUNTER — Encounter: Payer: Self-pay | Admitting: Certified Registered Nurse Anesthetist

## 2022-01-28 ENCOUNTER — Ambulatory Visit (AMBULATORY_SURGERY_CENTER): Payer: BC Managed Care – PPO | Admitting: Internal Medicine

## 2022-01-28 ENCOUNTER — Encounter: Payer: Self-pay | Admitting: Internal Medicine

## 2022-01-28 VITALS — BP 110/72 | HR 57 | Temp 97.8°F | Resp 14 | Ht 73.0 in | Wt 210.0 lb

## 2022-01-28 DIAGNOSIS — Z1211 Encounter for screening for malignant neoplasm of colon: Secondary | ICD-10-CM

## 2022-01-28 DIAGNOSIS — D128 Benign neoplasm of rectum: Secondary | ICD-10-CM

## 2022-01-28 DIAGNOSIS — K5140 Inflammatory polyps of colon without complications: Secondary | ICD-10-CM

## 2022-01-28 DIAGNOSIS — K621 Rectal polyp: Secondary | ICD-10-CM | POA: Diagnosis not present

## 2022-01-28 MED ORDER — SODIUM CHLORIDE 0.9 % IV SOLN: 500.0000 mL | Freq: Once | INTRAVENOUS | Status: DC

## 2022-01-28 NOTE — Op Note (Signed)
[facility] Patient Name: Thomas Duran Procedure Date: 01/28/2022 9:34 AM MRN: [id number] Endoscopist: Thomas Duran "Thomas Duran ,  Age: 46 Referring MD:  Date of Birth: 01-Mar-1976 Gender: Male Account #: [id number] Procedure:                Colonoscopy Indications:              Screening for colorectal malignant neoplasm, This                            is the patient's first colonoscopy Medicines:                Monitored Anesthesia Care Procedure:                Pre-Anesthesia Assessment:                           - Prior to the procedure, a History and Physical                            was performed, and patient medications and                            allergies were reviewed. The patient's tolerance of                            previous anesthesia was also reviewed. The risks                            and benefits of the procedure and the sedation                            options and risks were discussed with the patient.                            All questions were answered, and informed consent                            was obtained. Prior Anticoagulants: The patient has                            taken no previous anticoagulant or antiplatelet                            agents. ASA Grade Assessment: II - A patient with                            mild systemic disease. After reviewing the risks                            and benefits, the patient was deemed in                            satisfactory condition to undergo the procedure.  After obtaining informed consent, the colonoscope                            was passed under direct vision. Throughout the                            procedure, the patient's blood pressure, pulse, and                            oxygen saturations were monitored continuously. The                            CF [id number] #[id number] was introduced through the anus                            and advanced to  the the terminal ileum. The                            colonoscopy was performed without difficulty. The                            patient tolerated the procedure well. The quality                            of the bowel preparation was good. The terminal                            ileum, ileocecal valve, appendiceal orifice, and                            rectum were photographed. Scope In: 9:36:37 AM Scope Out: 9:51:12 AM Scope Withdrawal Time: 0 hours 12 minutes 37 seconds  Total Procedure Duration: 0 hours 14 minutes 35 seconds  Findings:                 The terminal ileum appeared normal.                           A 10 mm polyp was found in the rectum. The polyp                            was pedunculated. The polyp was removed with a                            piecemeal technique using a hot snare. Resection                            and retrieval were complete.                           Non-bleeding internal hemorrhoids were found during                            retroflexion. Complications:  No immediate complications. Estimated Blood Loss:     Estimated blood loss was minimal. Impression:               - The examined portion of the ileum was normal.                           - One 10 mm polyp in the rectum, removed piecemeal                            using a hot snare. Resected and retrieved.                           - Non-bleeding internal hemorrhoids. Recommendation:           - Discharge patient to home (with escort).                           - Await pathology results.                           - The findings and recommendations were discussed                            with the patient. Dr Thomas Duran "Thomas Duran" Thomas Duran,  01/28/2022 9:55:49 AM

## 2022-01-28 NOTE — Progress Notes (Signed)
Report given to PACU, vss 

## 2022-01-28 NOTE — Progress Notes (Signed)
VS completed by [place].   Pt's states no medical or surgical changes since previsit or office visit.  

## 2022-01-28 NOTE — Patient Instructions (Signed)
-   Discharge patient to home (with escort). - Await pathology results. - The findings and recommendations were discussed with the patient.  Handouts on polyps and hemorrhoids given.  YOU HAD AN ENDOSCOPIC PROCEDURE TODAY AT THE [facility]:   Refer to the procedure report that was given to you for any specific questions about what was found during the examination.  If the procedure report does not answer your questions, please call your gastroenterologist to clarify.  If you requested that your care partner not be given the details of your procedure findings, then the procedure report has been included in a sealed envelope for you to review at your convenience later.  YOU SHOULD EXPECT: Some feelings of bloating in the abdomen. Passage of more gas than usual.  Walking can help get rid of the air that was put into your GI tract during the procedure and reduce the bloating. If you had a lower endoscopy (such as a colonoscopy or flexible sigmoidoscopy) you may notice spotting of blood in your stool or on the toilet paper. If you underwent a bowel prep for your procedure, you may not have a normal bowel movement for a few days.  Please Note:  You might notice some irritation and congestion in your nose or some drainage.  This is from the oxygen used during your procedure.  There is no need for concern and it should clear up in a day or so.  SYMPTOMS TO REPORT IMMEDIATELY:  Following lower endoscopy (colonoscopy or flexible sigmoidoscopy):  Excessive amounts of blood in the stool  Significant tenderness or worsening of abdominal pains  Swelling of the abdomen that is new, acute  Fever of 100F or higher  For urgent or emergent issues, a gastroenterologist can be reached at any hour by calling [phone]. Do not use MyChart messaging for urgent concerns.    DIET:  We do recommend a small meal at first, but then you may proceed to your regular diet.  Drink plenty of fluids but you  should avoid alcoholic beverages for 24 hours.  ACTIVITY:  You should plan to take it easy for the rest of today and you should NOT DRIVE or use heavy machinery until tomorrow (because of the sedation medicines used during the test).    FOLLOW UP: Our staff will call the number listed on your records the next business day following your procedure.  We will call around 7:15- 8:00 am to check on you and address any questions or concerns that you may have regarding the information given to you following your procedure. If we do not reach you, we will leave a message.     If any biopsies were taken you will be contacted by phone or by letter within the next 1-3 weeks.  Please call us at [phone] if you have not heard about the biopsies in 3 weeks.    SIGNATURES/CONFIDENTIALITY: You and/or your care partner have signed paperwork which will be entered into your electronic medical record.  These signatures attest to the fact that that the information above on your After Visit Summary has been reviewed and is understood.  Full responsibility of the confidentiality of this discharge information lies with you and/or your care-partner. 

## 2022-01-28 NOTE — Progress Notes (Signed)
Called to room to assist during endoscopic procedure.  Patient ID and intended procedure confirmed with present staff. Received instructions for my participation in the procedure from the performing physician.  

## 2022-01-28 NOTE — Progress Notes (Signed)
GASTROENTEROLOGY PROCEDURE H&P NOTE   Primary Care Physician: Marin Olp, MD    Reason for Procedure:   Colon cancer screening  Plan:    Colonoscopy  Patient is appropriate for endoscopic procedure(s) in the ambulatory ([facility]) setting.  The nature of the procedure, as well as the risks, benefits, and alternatives were carefully and thoroughly reviewed with the patient. Ample time for discussion and questions allowed. The patient understood, was satisfied, and agreed to proceed.     HPI: Thomas Duran is a 46 y.o. male who presents for colonoscopy for colon cancer screening. Denies blood in stools, changes in bowel habits, or unintentional weight loss. Denies family history of colon cancer.  Past Medical History:  Diagnosis Date   Generalized anxiety disorder    GERD (gastroesophageal reflux disease)    Hypogonadism in male    in past - Managed by endo - Dr. Buddy Duty. self weaned from meds and no signifcant change in symptoms.     Past Surgical History:  Procedure Laterality Date   WISDOM TOOTH EXTRACTION      Prior to Admission medications   Medication Sig Start Date End Date Taking? Authorizing Provider  escitalopram (LEXAPRO) 20 MG tablet Take 1 tablet (20 mg total) by mouth daily. 11/26/21  Yes Marin Olp, MD  omeprazole (PRILOSEC) 20 MG capsule Take 20 mg by mouth daily.   Yes [provider]  vitamin B-12 (CYANOCOBALAMIN) 100 MCG tablet Take 100 mcg by mouth daily.   Yes [provider]  cyclobenzaprine (FLEXERIL) 5 MG tablet Take 1 tablet (5 mg total) by mouth at bedtime as needed for muscle spasms (in neck. do not drive for 8 hours after taking). Patient not taking: Reported on 01/18/2022 11/26/21   Marin Olp, MD    Current Outpatient Medications  Medication Sig Dispense Refill   escitalopram (LEXAPRO) 20 MG tablet Take 1 tablet (20 mg total) by mouth daily. 90 tablet 3   omeprazole (PRILOSEC) 20 MG capsule Take 20 mg by  mouth daily.     vitamin B-12 (CYANOCOBALAMIN) 100 MCG tablet Take 100 mcg by mouth daily.     cyclobenzaprine (FLEXERIL) 5 MG tablet Take 1 tablet (5 mg total) by mouth at bedtime as needed for muscle spasms (in neck. do not drive for 8 hours after taking). (Patient not taking: Reported on 01/18/2022) 30 tablet 1   Current Facility-Administered Medications  Medication Dose Route Frequency Provider Last Rate Last Admin   0.9 %  sodium chloride infusion  500 mL Intravenous Once Sharyn Creamer, MD        Allergies as of 01/28/2022   (No Known Allergies)    Family History  Problem Relation Age of Onset   Healthy Mother    Colon polyps Father    Arthritis Father    Anxiety disorder Sister    Schizophrenia Brother        died 31 - suicide. diagnosis was unclear- brother would not get help   Dementia Maternal Grandfather    Cancer Paternal Grandmother        unknown= in bones   Emphysema Paternal Grandfather    Heart Problems Paternal Grandfather        pacemaker   Colon cancer Neg Hx    Esophageal cancer Neg Hx    Stomach cancer Neg Hx    Rectal cancer Neg Hx     Social History   Socioeconomic History   Marital status: Married    Spouse  name: Not on file   Number of children: 2   Years of education: Not on file   Highest education level: Not on file  Occupational History   Occupation: Careers adviser: [employer]    Comment: Therapist, sports  Tobacco Use   Smoking status: Never   Smokeless tobacco: Never  Vaping Use   Vaping Use: Never used  Substance and Sexual Activity   Alcohol use: Yes    Alcohol/week: 0.0 standard drinks of alcohol    Comment: 0-3 a week   Drug use: No   Sexual activity: Not on file  Other Topics Concern   Not on file  Social History Narrative   Married. 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      Hobbies: time with daughters, enjoys football college mainly- [employer] grad, enjoys beach   Social Determinants of Radio broadcast assistant Strain: Not on file  Food Insecurity: Not on file  Transportation Needs: Not on file  Physical Activity: Not on file  Stress: Not on file  Social Connections: Not on file  Intimate Partner Violence: Not on file    Physical Exam: Vital signs in last 24 hours: BP 122/67   Pulse (!) 57   Temp 97.8 F (36.6 C) (Temporal)   Ht '6\' 1"'$  (1.854 m)   Wt 210 lb (95.3 kg)   SpO2 97%   BMI 27.71 kg/m  GEN: NAD EYE: Sclerae anicteric ENT: MMM CV: Non-tachycardic Pulm: No increased work of breathing GI: Soft, NT/ND NEURO:  Alert & Oriented   Christia Reading, MD [facility]  01/28/2022 9:24 AM

## 2022-01-29 ENCOUNTER — Telehealth: Payer: Self-pay

## 2022-01-29 NOTE — Telephone Encounter (Signed)
  Follow up Call-     01/28/2022    8:56 AM  Call back number  Post procedure Call Back phone  # [phone]  Permission to leave phone message Yes     Patient questions:  Do you have a fever, pain , or abdominal swelling? No. Pain Score  0 *  Have you tolerated food without any problems? Yes.    Have you been able to return to your normal activities? Yes.    Do you have any questions about your discharge instructions: Diet   No. Medications  No. Follow up visit  No.  Do you have questions or concerns about your Care? No.  Actions: * If pain score is 4 or above: No action needed, pain <4.

## 2022-01-30 ENCOUNTER — Encounter: Payer: Self-pay | Admitting: Internal Medicine

## 2022-02-07 ENCOUNTER — Other Ambulatory Visit: Payer: Self-pay | Admitting: Family Medicine

## 2022-04-25 DIAGNOSIS — M546 Pain in thoracic spine: Secondary | ICD-10-CM | POA: Diagnosis not present

## 2022-04-25 DIAGNOSIS — M542 Cervicalgia: Secondary | ICD-10-CM | POA: Diagnosis not present

## 2022-04-25 DIAGNOSIS — M9901 Segmental and somatic dysfunction of cervical region: Secondary | ICD-10-CM | POA: Diagnosis not present

## 2022-04-25 DIAGNOSIS — M9902 Segmental and somatic dysfunction of thoracic region: Secondary | ICD-10-CM | POA: Diagnosis not present

## 2022-06-18 ENCOUNTER — Other Ambulatory Visit (INDEPENDENT_AMBULATORY_CARE_PROVIDER_SITE_OTHER): Payer: BC Managed Care – PPO

## 2022-06-18 DIAGNOSIS — E785 Hyperlipidemia, unspecified: Secondary | ICD-10-CM

## 2022-06-18 LAB — LIPID PANEL
Cholesterol: 257 mg/dL — ABNORMAL HIGH (ref 0–200)
HDL: 34.8 mg/dL — ABNORMAL LOW (ref >39.00)
LDL Cholesterol: 184 mg/dL — ABNORMAL HIGH (ref 0–99)
NonHDL: 222.42
Total CHOL/HDL Ratio: 7
Triglycerides: 193 mg/dL — ABNORMAL HIGH (ref 0.0–149.0)
VLDL: 38.6 mg/dL (ref 0.0–40.0)

## 2022-07-25 ENCOUNTER — Other Ambulatory Visit: Payer: Self-pay | Admitting: Family Medicine

## 2022-09-03 DIAGNOSIS — M546 Pain in thoracic spine: Secondary | ICD-10-CM | POA: Diagnosis not present

## 2022-09-03 DIAGNOSIS — M9902 Segmental and somatic dysfunction of thoracic region: Secondary | ICD-10-CM | POA: Diagnosis not present

## 2022-09-03 DIAGNOSIS — M542 Cervicalgia: Secondary | ICD-10-CM | POA: Diagnosis not present

## 2022-09-03 DIAGNOSIS — M9901 Segmental and somatic dysfunction of cervical region: Secondary | ICD-10-CM | POA: Diagnosis not present

## 2022-11-13 ENCOUNTER — Encounter (INDEPENDENT_AMBULATORY_CARE_PROVIDER_SITE_OTHER): Payer: Self-pay

## 2022-11-29 ENCOUNTER — Ambulatory Visit (INDEPENDENT_AMBULATORY_CARE_PROVIDER_SITE_OTHER): Payer: BC Managed Care – PPO | Admitting: Family Medicine

## 2022-11-29 ENCOUNTER — Encounter: Payer: Self-pay | Admitting: Family Medicine

## 2022-11-29 VITALS — BP 108/70 | HR 51 | Temp 97.8°F | Ht 73.0 in | Wt 222.4 lb

## 2022-11-29 DIAGNOSIS — R7989 Other specified abnormal findings of blood chemistry: Secondary | ICD-10-CM | POA: Diagnosis not present

## 2022-11-29 DIAGNOSIS — Z1283 Encounter for screening for malignant neoplasm of skin: Secondary | ICD-10-CM

## 2022-11-29 DIAGNOSIS — Z0000 Encounter for general adult medical examination without abnormal findings: Secondary | ICD-10-CM

## 2022-11-29 DIAGNOSIS — F411 Generalized anxiety disorder: Secondary | ICD-10-CM | POA: Diagnosis not present

## 2022-11-29 DIAGNOSIS — F325 Major depressive disorder, single episode, in full remission: Secondary | ICD-10-CM

## 2022-11-29 DIAGNOSIS — E785 Hyperlipidemia, unspecified: Secondary | ICD-10-CM | POA: Diagnosis not present

## 2022-11-29 LAB — LIPID PANEL
Cholesterol: 272 mg/dL — ABNORMAL HIGH (ref 0–200)
HDL: 32.3 mg/dL — ABNORMAL LOW (ref >39.00)
LDL Cholesterol: 215 mg/dL — ABNORMAL HIGH (ref 0–99)
NonHDL: 239.93
Total CHOL/HDL Ratio: 8
Triglycerides: 123 mg/dL (ref 0.0–149.0)
VLDL: 24.6 mg/dL (ref 0.0–40.0)

## 2022-11-29 LAB — CBC WITH DIFFERENTIAL/PLATELET
Basophils Absolute: 0 10*3/uL (ref 0.0–0.1)
Basophils Relative: 0.6 % (ref 0.0–3.0)
Eosinophils Absolute: 0.2 10*3/uL (ref 0.0–0.7)
Eosinophils Relative: 3.1 % (ref 0.0–5.0)
HCT: 43 % (ref 39.0–52.0)
Hemoglobin: 14.2 g/dL (ref 13.0–17.0)
Lymphocytes Relative: 33.1 % (ref 12.0–46.0)
Lymphs Abs: 1.8 10*3/uL (ref 0.7–4.0)
MCHC: 33 g/dL (ref 30.0–36.0)
MCV: 88.9 fl (ref 78.0–100.0)
Monocytes Absolute: 0.5 10*3/uL (ref 0.1–1.0)
Monocytes Relative: 8.8 % (ref 3.0–12.0)
Neutro Abs: 3 10*3/uL (ref 1.4–7.7)
Neutrophils Relative %: 54.4 % (ref 43.0–77.0)
Platelets: 218 10*3/uL (ref 150.0–400.0)
RBC: 4.83 Mil/uL (ref 4.22–5.81)
RDW: 13.3 % (ref 11.5–15.5)
WBC: 5.6 10*3/uL (ref 4.0–10.5)

## 2022-11-29 LAB — COMPREHENSIVE METABOLIC PANEL
ALT: 32 U/L (ref 0–53)
AST: 18 U/L (ref 0–37)
Albumin: 4.7 g/dL (ref 3.5–5.2)
Alkaline Phosphatase: 42 U/L (ref 39–117)
BUN: 13 mg/dL (ref 6–23)
CO2: 27 mEq/L (ref 19–32)
Calcium: 9.6 mg/dL (ref 8.4–10.5)
Chloride: 101 mEq/L (ref 96–112)
Creatinine, Ser: 1 mg/dL (ref 0.40–1.50)
GFR: 90.64 mL/min (ref >60.00)
Glucose, Bld: 96 mg/dL (ref 70–99)
Potassium: 3.9 mEq/L (ref 3.5–5.1)
Sodium: 135 mEq/L (ref 135–145)
Total Bilirubin: 0.7 mg/dL (ref 0.2–1.2)
Total Protein: 7.3 g/dL (ref 6.0–8.3)

## 2022-11-29 LAB — VITAMIN B12: Vitamin B-12: 389 pg/mL (ref 211–911)

## 2022-11-29 MED ORDER — LORAZEPAM 1 MG PO TABS: 1.0000 mg | ORAL_TABLET | Freq: Two times a day (BID) | ORAL | 0 refills | Status: DC | PRN

## 2022-11-29 NOTE — Progress Notes (Signed)
Phone: [phone]    Subjective:  Patient presents today for their annual physical. Chief complaint-noted.   See problem oriented charting- ROS- full  review of systems was completed and negative  Per full ROS sheet completed by patient  The following were reviewed and entered/updated in epic: Past Medical History:  Diagnosis Date   Generalized anxiety disorder    GERD (gastroesophageal reflux disease)    Hypogonadism in male    in past - Managed by endo - Dr. Sharl Ma. self weaned from meds and no signifcant change in symptoms.    Patient Active Problem List   Diagnosis Date Noted   Major depression in full remission (HCC) 02/12/2017    Priority: High   Hyperlipidemia 03/14/2014    Priority: Medium    OSA (obstructive sleep apnea) 11/08/2011    Priority: Medium    GAD (generalized anxiety disorder) 02/13/2011    Priority: Medium    Gastroesophageal reflux disease 02/13/2011    Priority: Medium    Hypogonadism male 02/13/2011    Priority: Medium    Chronic cough 06/29/2014    Priority: Low   MRSA infection 03/29/2011    Priority: Low   Past Surgical History:  Procedure Laterality Date   WISDOM TOOTH EXTRACTION      Family History  Problem Relation Age of Onset   Transient ischemic attack Mother    Hypertension Mother    Colon polyps Father    Arthritis Father        including knee replacement   Anxiety disorder Sister    Schizophrenia Brother        died 75 - suicide. diagnosis was unclear- brother would not get help   Dementia Maternal Grandfather    Cancer Paternal Grandmother        unknown= in bones   Emphysema Paternal Grandfather    Heart Problems Paternal Grandfather        pacemaker   Colon cancer Neg Hx    Esophageal cancer Neg Hx    Stomach cancer Neg Hx    Rectal cancer Neg Hx     Medications- reviewed and updated Current Outpatient Medications  Medication Sig Dispense Refill   escitalopram (LEXAPRO) 20 MG tablet TAKE 1 TABLET BY MOUTH  EVERY DAY 90 tablet 4   omeprazole (PRILOSEC) 20 MG capsule Take 20 mg by mouth daily.     cyclobenzaprine (FLEXERIL) 5 MG tablet Take 1 tablet (5 mg total) by mouth at bedtime as needed for muscle spasms (in neck. do not drive for 8 hours after taking). (Patient not taking: Reported on 11/29/2022) 30 tablet 1   LORazepam (ATIVAN) 1 MG tablet Take 1 tablet (1 mg total) by mouth 2 (two) times daily as needed for anxiety (do not drive for 8 hours after taking). 20 tablet 0   No current facility-administered medications for this visit.    Allergies-reviewed and updated No Known Allergies  Social History   Social History Narrative   Married. 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 Oropharynx normal Neck: no thyromegaly CV: RRR no murmurs rubs or gallops Lungs: CTAB no crackles, wheeze, rhonchi Abdomen: soft/nontender/nondistended/normal bowel sounds. No rebound or guarding.  Ext: no edema Skin: warm, dry Neuro: grossly normal, moves all extremities, PERRLA     Assessment and Plan:  47 y.o. male presenting for annual physical.  Health Maintenance counseling: 1. Anticipatory guidance: Patient counseled regarding regular dental exams -q6 months, eye exams - no vission issues,  avoiding smoking and second hand smoke , limiting alcohol to 2 beverages per day- rare social, no illicit drugs.   2. Risk factor reduction:  Advised patient of need for regular exercise and diet rich and fruits and  vegetables to reduce risk of heart attack and stroke.  Exercise- walking 5 days a week or 6- listens to podcast and walks dog.  Diet/weight management-weight stable from last year-states had lost some weight but regained with recent stressors- did get down to 210 in October of last year. Wants to get under 200 Wt Readings from Last 3 Encounters:  11/29/22 222 lb 6.4 oz (100.9 kg)  01/28/22 210 lb (95.3 kg)  01/18/22 210 lb (95.3 kg)  3. Immunizations/screenings/ancillary studies-declines flu and COVID shots this season  Immunization History  Administered Date(s) Administered   Tdap 10/31/2014  4. Prostate cancer screening- low risk initial PSA, no family history and we discussed regular screening at 72 or 71.  Lab Results  Component Value Date   PSA 0.21 06/25/2018   5. Colon cancer screening - 01/28/22 with 10 year follow up planned with Dr. Leonides Schanz 6. Skin cancer screening/prevention-saw dermatology many years ago.  advised regular sunscreen use. Denies worrisome, changing, or new skin lesions- possible cyst below ear- will refer back for general check and he can ask about this 7. Testicular cancer screening- advised monthly self exams  8. STD screening- patient opts out-only active with wife 9. Smoking associated screening-never smoker  Status of chronic or acute concerns   #hyperlipidemia S: Medication:none The 10-year ASCVD risk score (Arnett DK, et al., 2019) is: 3.6%  A/P: update lipids- keep working on healthy eating and regular exercise and weight loss  #OSA- compliant wth CPAP  # GERD S:Medication: Nexium 40Mg  then omeprazole 20 mg now OTC omeprazole 20 mg A/P: doing well- could try Pepcid OTC (available over the counter without a prescription) backup unfortunately needed twice a day   #Low B12- we had mentioned taking once weekly after one month- he did one bottle but has been off for sometime- want to make sure still over 400.   # Depression/GAD S: Medication: lexapro  20 mg in past- weaned off in 2022 but with later significant recurrence of symptoms and had to restart -Did some counseling with Dr. Evelene Croon in the past -in recent month or so- has had some increased stressors and anxiety is higher- has used ativan maybe once or twice a week. Dad with knee replacement and mom with some TIAs and very stressful. Plans to head up today- [place]. Hadn't needed it in a while    11/29/2022    8:02 AM 11/26/2021    8:21 AM 07/18/2021    7:59 AM  Depression screen PHQ 2/9  Decreased Interest 0 0 0  Down, Depressed, Hopeless 0 0 0  PHQ - 2 Score 0 0 0  Altered  sleeping 1 0 2  Tired, decreased energy 1 0 0  Change in appetite 0 0 0  Feeling bad or failure about yourself  0 0 0  Trouble concentrating 0 0 0  Moving slowly or fidgety/restless 0 0 0  Suicidal thoughts 0 0 0  PHQ-9 Score 2 0 2  Difficult doing work/chores Not difficult at all Not difficult at all Not difficult at all       11/29/2022    8:02 AM 07/18/2021    7:58 AM 12/15/2020    8:12 AM 11/09/2020    9:56 AM  GAD 7 : Generalized Anxiety Score  Nervous, Anxious, on Edge 1 1 1 3   Control/stop worrying 0 2 1 3   Worry too much - different things 0 1 1 3   Trouble relaxing 0 0 1 1  Restless 0 0 0 0  Easily annoyed or irritable 1 0 1 0  Afraid - awful might happen 0 0 1 1  Total GAD 7 Score 2 4 6 11   Anxiety Difficulty Not difficult at all Not difficult at all Not difficult at all Somewhat difficult   A/P: depression and generalized anxiety disorder in full remission despite recent stressors- does need refill on ativan and he will continue buspirone   # Neck and back tension-being Flexeril is helpful particularly for sleep - not needing much   Recommended follow up: Return in about 1 year (around 11/29/2023) for physical or sooner if needed.Schedule b4 you leave.  Lab/Order associations: fasting   ICD-10-CM   1. Routine general medical examination at a health care facility  Z00.00     2. GAD  (generalized anxiety disorder)  F41.1     3. Major depressive disorder in full remission, unspecified whether recurrent (HCC) Chronic F32.5     4. Hyperlipidemia, unspecified hyperlipidemia type  E78.5 Comprehensive metabolic panel    CBC with Differential/Platelet    Lipid panel    5. Skin cancer screening  Z12.83 Ambulatory referral to Dermatology    6. Low vitamin B12 level  R79.89 Vitamin B12      Meds ordered this encounter  Medications   LORazepam (ATIVAN) 1 MG tablet    Sig: Take 1 tablet (1 mg total) by mouth 2 (two) times daily as needed for anxiety (do not drive for 8 hours after taking).    Dispense:  20 tablet    Refill:  0    This request is for a new prescription for a controlled substance as required by Federal/State law.   Return precautions advised.   Tana Conch, MD

## 2022-11-29 NOTE — Addendum Note (Signed)
Addended by: Shelva Majestic on: 11/29/2022 05:47 PM   Modules accepted: Level of Service

## 2022-11-29 NOTE — Patient Instructions (Addendum)
Please stop by lab before you go If you have mychart- we will send your results within 3 business days of [place] receiving them.  If you do not have mychart- we will call you about results within 5 business days of [place] receiving them.  *please also note that you will see labs on mychart as soon as they post. I will later go in and write notes on them- will say "notes from Dr. Durene Cal"   We have placed a referral for you today to dermatology . In some cases you will see # listed below- you can call this if you have not heard within a week. If you do not see # listed- you should receive a mychart message or phone call within a week with the # to call directly- call that as soon as you get it. If you are having issues getting scheduled reach out to [place] again.   doing well- could try Pepcid/famotidine OTC (available over the counter without a prescription) backup unfortunately needed twice a day before meals- better potential side effects profile   I like your idea of working back down to 200 within 1-2 years  Recommended follow up: Return in about 1 year (around 11/29/2023) for physical or sooner if needed.Schedule b4 you leave.

## 2023-02-17 DIAGNOSIS — M542 Cervicalgia: Secondary | ICD-10-CM | POA: Diagnosis not present

## 2023-02-17 DIAGNOSIS — M9901 Segmental and somatic dysfunction of cervical region: Secondary | ICD-10-CM | POA: Diagnosis not present

## 2023-02-17 DIAGNOSIS — M546 Pain in thoracic spine: Secondary | ICD-10-CM | POA: Diagnosis not present

## 2023-02-17 DIAGNOSIS — M9902 Segmental and somatic dysfunction of thoracic region: Secondary | ICD-10-CM | POA: Diagnosis not present

## 2023-02-18 ENCOUNTER — Other Ambulatory Visit: Payer: Self-pay | Admitting: Family Medicine

## 2023-04-03 DIAGNOSIS — M546 Pain in thoracic spine: Secondary | ICD-10-CM | POA: Diagnosis not present

## 2023-04-03 DIAGNOSIS — M9901 Segmental and somatic dysfunction of cervical region: Secondary | ICD-10-CM | POA: Diagnosis not present

## 2023-04-03 DIAGNOSIS — M542 Cervicalgia: Secondary | ICD-10-CM | POA: Diagnosis not present

## 2023-04-03 DIAGNOSIS — M9902 Segmental and somatic dysfunction of thoracic region: Secondary | ICD-10-CM | POA: Diagnosis not present

## 2023-08-04 ENCOUNTER — Encounter: Payer: Self-pay | Admitting: Dermatology

## 2023-08-04 ENCOUNTER — Ambulatory Visit: Payer: BC Managed Care – PPO | Admitting: Dermatology

## 2023-08-04 VITALS — BP 118/88

## 2023-08-04 DIAGNOSIS — D1801 Hemangioma of skin and subcutaneous tissue: Secondary | ICD-10-CM

## 2023-08-04 DIAGNOSIS — Z1283 Encounter for screening for malignant neoplasm of skin: Secondary | ICD-10-CM

## 2023-08-04 DIAGNOSIS — D2270 Melanocytic nevi of unspecified lower limb, including hip: Secondary | ICD-10-CM

## 2023-08-04 DIAGNOSIS — D2372 Other benign neoplasm of skin of left lower limb, including hip: Secondary | ICD-10-CM

## 2023-08-04 DIAGNOSIS — L814 Other melanin hyperpigmentation: Secondary | ICD-10-CM | POA: Diagnosis not present

## 2023-08-04 DIAGNOSIS — L821 Other seborrheic keratosis: Secondary | ICD-10-CM

## 2023-08-04 DIAGNOSIS — D239 Other benign neoplasm of skin, unspecified: Secondary | ICD-10-CM

## 2023-08-04 DIAGNOSIS — L739 Follicular disorder, unspecified: Secondary | ICD-10-CM

## 2023-08-04 DIAGNOSIS — L578 Other skin changes due to chronic exposure to nonionizing radiation: Secondary | ICD-10-CM

## 2023-08-04 DIAGNOSIS — W908XXA Exposure to other nonionizing radiation, initial encounter: Secondary | ICD-10-CM

## 2023-08-04 DIAGNOSIS — D229 Melanocytic nevi, unspecified: Secondary | ICD-10-CM

## 2023-08-04 MED ORDER — CLINDAMYCIN PHOSPHATE 1 % EX LOTN: TOPICAL_LOTION | Freq: Every day | CUTANEOUS | 11 refills | Status: DC

## 2023-08-04 MED ORDER — DOXYCYCLINE HYCLATE 100 MG PO TABS: 100.0000 mg | ORAL_TABLET | Freq: Two times a day (BID) | ORAL | 1 refills | Status: AC

## 2023-08-04 NOTE — Progress Notes (Signed)
 New Patient Visit   Subjective  Thomas Duran is a 48 y.o. male who presents for the following: Skin Cancer Screening and Full Body Skin Exam - No history of skin cancer, no family history of skin cancer. He sometimes breaks out with cysts on his face and would like to discuss a treatment.  The patient presents for Total-Body Skin Exam (TBSE) for skin cancer screening and mole check. The patient has spots, moles and lesions to be evaluated, some may be new or changing and the patient may have concern these could be cancer.    The following portions of the chart were reviewed this encounter and updated as appropriate: medications, allergies, medical history  Review of Systems:  No other skin or systemic complaints except as noted in HPI or Assessment and Plan.  Objective  Well appearing patient in no apparent distress; mood and affect are within normal limits.  A full examination was performed including scalp, head, eyes, ears, nose, lips, neck, chest, axillae, abdomen, back, buttocks, bilateral upper extremities, bilateral lower extremities, hands, feet, fingers, toes, fingernails, and toenails. All findings within normal limits unless otherwise noted below.   Relevant physical exam findings are noted in the Assessment and Plan.    Assessment & Plan   SKIN CANCER SCREENING PERFORMED TODAY.  ACTINIC DAMAGE - Chronic condition, secondary to cumulative UV/sun exposure - diffuse scaly erythematous macules with underlying dyspigmentation - Recommend daily broad spectrum sunscreen SPF 30+ to sun-exposed areas, reapply every 2 hours as needed.  - Staying in the shade or wearing long sleeves, sun glasses (UVA+UVB protection) and wide brim hats (4-inch brim around the entire circumference of the hat) are also recommended for sun protection.  - Call for new or changing lesions.  LENTIGINES, SEBORRHEIC KERATOSES, HEMANGIOMAS - Benign normal skin lesions - Benign-appearing - Call for any  changes  MELANOCYTIC NEVI - Tan-brown and/or pink-flesh-colored symmetric macules and papules - Benign appearing on exam today - Observation - Call clinic for new or changing moles - Recommend daily use of broad spectrum spf 30+ sunscreen to sun-exposed areas.   FOLLICULITIS Exam: Perifollicular erythematous papules and pustules of face  Folliculitis occurs due to inflammation of the superficial hair follicle (pore), resulting in acne-like lesions (pus bumps). It can be infectious (bacterial, fungal) or noninfectious (shaving, tight clothing, heat/sweat, medications).  Folliculitis can be acute or chronic and recommended treatment depends on the underlying cause of folliculitis.  Treatment Plan: Doxycycline  100 mg 1 tablet twice daily with food and plenty of fluid x 2 weeks - he will have a refill for flares. Advise to take twice daily x 1 week for flares. Cerave Zinc shampoo for scalp - sample given.  Clindamycin  lotion daily in the morning. Recommend he wash with Cerave 4% BPO wash.  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  Treatment options to remove include shave or excision with resulting scar and risk of recurrence.  Since benign-appearing and not bothersome, will observe for now.    SKIN EXAM FOR MALIGNANT NEOPLASM   MULTIPLE BENIGN MELANOCYTIC NEVI   CHERRY ANGIOMA   LENTIGINES   SEBORRHEIC KERATOSIS   ACTINIC SKIN DAMAGE   DERMATOFIBROMA  NEVUS SPILUS OF THIGH, UNSPECIFIED LATERALITY   FOLLICULITIS    Return in about 1 year (around 08/03/2024).   Documentation: I have reviewed the above documentation for accuracy and completeness,  and I agree with the above.  Louana Roup, DO

## 2023-08-04 NOTE — Patient Instructions (Addendum)
 Hello [name],  Thank you for visiting today. Here is a summary of the key instructions:  - Medications:   - Take doxycycline  100 mg twice a day with food for 2 weeks   - Use CeraVe wash with benzoyl peroxide daily   - Apply clindamycin  lotion after washing  - Skin Care:   - Apply sunscreen daily to face, neck, and chest   - Reapply sunscreen every 3-4 hours when at the beach   - Use CeraVe Dandruff Shampoo for scalp inflammation  - Follow-up:   - Return for yearly skin check   - Make an appointment if condition worsens or doesn't improve in 2-3 months  - When to Seek Medical Attention:   - Come in for a biopsy if any pimple doesn't heal after 2 months, especially on face, ears, or neck  Please reach out if you have any questions or concerns.  Warm regards,  Dr. Louana Roup [facility]      Doxycycline  should be taken with food to prevent nausea. Do not lay down for 30 minutes after taking. Be cautious with sun exposure and use good sun protection while on this medication. Pregnant women should not take this medication.     Important Information  Due to recent changes in healthcare laws, you may see results of your pathology and/or laboratory studies on MyChart before the doctors have had a chance to review them. We understand that in some cases there may be results that are confusing or concerning to you. Please understand that not all results are received at the same time and often the doctors may need to interpret multiple results in order to provide you with the best plan of care or course of treatment. Therefore, we ask that you please give us  2 business days to thoroughly review all your results before contacting the office for clarification. Should we see a critical lab result, you will be contacted sooner.   If You Need Anything After Your Visit  If you have any questions or concerns for your doctor, please call our main line at [phone] If no one answers, please  leave a voicemail as directed and we will return your call as soon as possible. Messages left after 4 pm will be answered the following business day.   You may also send us  a message via MyChart. We typically respond to MyChart messages within 1-2 business days.  For prescription refills, please ask your pharmacy to contact our office. Our fax number is [phone].  If you have an urgent issue when the clinic is closed that cannot wait until the next business day, you can page your doctor at the number below.    Please note that while we do our best to be available for urgent issues outside of office hours, we are not available 24/7.   If you have an urgent issue and are unable to reach us , you may choose to seek medical care at your doctor's office, retail clinic, urgent care center, or emergency room.  If you have a medical emergency, please immediately call 911 or go to the emergency department. In the event of inclement weather, please call our main line at [phone] for an update on the status of any delays or closures.  Dermatology Medication Tips: Please keep the boxes that topical medications come in in order to help keep track of the instructions about where and how to use these. Pharmacies typically print the medication instructions only on the boxes and not  directly on the medication tubes.   If your medication is too expensive, please contact our office at [phone] or send us  a message through MyChart.   We are unable to tell what your co-pay for medications will be in advance as this is different depending on your insurance coverage. However, we may be able to find a substitute medication at lower cost or fill out paperwork to get insurance to cover a needed medication.   If a prior authorization is required to get your medication covered by your insurance company, please allow us  1-2 business days to complete this process.  Drug prices often vary depending on where the  prescription is filled and some pharmacies may offer cheaper prices.  The website www.goodrx.com contains coupons for medications through different pharmacies. The prices here do not account for what the cost may be with help from insurance (it may be cheaper with your insurance), but the website can give you the price if you did not use any insurance.  - You can print the associated coupon and take it with your prescription to the pharmacy.  - You may also stop by our office during regular business hours and pick up a GoodRx coupon card.  - If you need your prescription sent electronically to a different pharmacy, notify our office through [facility] or by phone at [phone]

## 2023-08-19 DIAGNOSIS — M546 Pain in thoracic spine: Secondary | ICD-10-CM | POA: Diagnosis not present

## 2023-08-19 DIAGNOSIS — M9901 Segmental and somatic dysfunction of cervical region: Secondary | ICD-10-CM | POA: Diagnosis not present

## 2023-08-19 DIAGNOSIS — M9902 Segmental and somatic dysfunction of thoracic region: Secondary | ICD-10-CM | POA: Diagnosis not present

## 2023-08-19 DIAGNOSIS — M542 Cervicalgia: Secondary | ICD-10-CM | POA: Diagnosis not present

## 2023-12-08 DIAGNOSIS — M9901 Segmental and somatic dysfunction of cervical region: Secondary | ICD-10-CM | POA: Diagnosis not present

## 2023-12-08 DIAGNOSIS — M546 Pain in thoracic spine: Secondary | ICD-10-CM | POA: Diagnosis not present

## 2023-12-08 DIAGNOSIS — M542 Cervicalgia: Secondary | ICD-10-CM | POA: Diagnosis not present

## 2023-12-08 DIAGNOSIS — M9902 Segmental and somatic dysfunction of thoracic region: Secondary | ICD-10-CM | POA: Diagnosis not present

## 2023-12-22 ENCOUNTER — Ambulatory Visit: Payer: Self-pay | Admitting: Family Medicine

## 2023-12-22 ENCOUNTER — Ambulatory Visit (INDEPENDENT_AMBULATORY_CARE_PROVIDER_SITE_OTHER): Payer: BC Managed Care – PPO | Admitting: Family Medicine

## 2023-12-22 ENCOUNTER — Encounter: Payer: Self-pay | Admitting: Family Medicine

## 2023-12-22 VITALS — BP 102/70 | HR 53 | Temp 97.4°F | Ht 73.0 in | Wt 219.4 lb

## 2023-12-22 DIAGNOSIS — K219 Gastro-esophageal reflux disease without esophagitis: Secondary | ICD-10-CM | POA: Diagnosis not present

## 2023-12-22 DIAGNOSIS — Z0000 Encounter for general adult medical examination without abnormal findings: Secondary | ICD-10-CM

## 2023-12-22 DIAGNOSIS — E785 Hyperlipidemia, unspecified: Secondary | ICD-10-CM | POA: Diagnosis not present

## 2023-12-22 DIAGNOSIS — F411 Generalized anxiety disorder: Secondary | ICD-10-CM | POA: Diagnosis not present

## 2023-12-22 DIAGNOSIS — Z79899 Other long term (current) drug therapy: Secondary | ICD-10-CM

## 2023-12-22 DIAGNOSIS — F325 Major depressive disorder, single episode, in full remission: Secondary | ICD-10-CM

## 2023-12-22 LAB — COMPREHENSIVE METABOLIC PANEL WITH GFR
ALT: 28 U/L (ref 0–53)
AST: 18 U/L (ref 0–37)
Albumin: 4.6 g/dL (ref 3.5–5.2)
Alkaline Phosphatase: 44 U/L (ref 39–117)
BUN: 13 mg/dL (ref 6–23)
CO2: 28 meq/L (ref 19–32)
Calcium: 9.8 mg/dL (ref 8.4–10.5)
Chloride: 104 meq/L (ref 96–112)
Creatinine, Ser: 1.02 mg/dL (ref 0.40–1.50)
GFR: 87.86 mL/min (ref >60.00)
Glucose, Bld: 98 mg/dL (ref 70–99)
Potassium: 4.2 meq/L (ref 3.5–5.1)
Sodium: 139 meq/L (ref 135–145)
Total Bilirubin: 0.8 mg/dL (ref 0.2–1.2)
Total Protein: 7.4 g/dL (ref 6.0–8.3)

## 2023-12-22 LAB — LIPID PANEL
Cholesterol: 281 mg/dL — ABNORMAL HIGH (ref <200)
HDL: 38 mg/dL — ABNORMAL LOW (ref >=40)
LDL Cholesterol (Calc): 211 mg/dL — ABNORMAL HIGH
Non-HDL Cholesterol (Calc): 243 mg/dL — ABNORMAL HIGH (ref <130)
Total CHOL/HDL Ratio: 7.4 (calc) — ABNORMAL HIGH (ref <5.0)
Triglycerides: 158 mg/dL — ABNORMAL HIGH (ref <150)

## 2023-12-22 LAB — CBC WITH DIFFERENTIAL/PLATELET
Basophils Absolute: 0 K/uL (ref 0.0–0.1)
Basophils Relative: 0.7 % (ref 0.0–3.0)
Eosinophils Absolute: 0.1 K/uL (ref 0.0–0.7)
Eosinophils Relative: 2.8 % (ref 0.0–5.0)
HCT: 41.8 % (ref 39.0–52.0)
Hemoglobin: 14.3 g/dL (ref 13.0–17.0)
Lymphocytes Relative: 35.6 % (ref 12.0–46.0)
Lymphs Abs: 1.8 K/uL (ref 0.7–4.0)
MCHC: 34.1 g/dL (ref 30.0–36.0)
MCV: 86.8 fl (ref 78.0–100.0)
Monocytes Absolute: 0.4 K/uL (ref 0.1–1.0)
Monocytes Relative: 8.4 % (ref 3.0–12.0)
Neutro Abs: 2.7 K/uL (ref 1.4–7.7)
Neutrophils Relative %: 52.5 % (ref 43.0–77.0)
Platelets: 217 K/uL (ref 150.0–400.0)
RBC: 4.81 Mil/uL (ref 4.22–5.81)
RDW: 13 % (ref 11.5–15.5)
WBC: 5.1 K/uL (ref 4.0–10.5)

## 2023-12-22 LAB — VITAMIN B12: Vitamin B-12: >1500 pg/mL — ABNORMAL HIGH (ref 211–911)

## 2023-12-22 NOTE — Progress Notes (Signed)
 Phone: [phone]    Subjective:  Patient presents today for their annual physical. Chief complaint-noted.   See problem oriented charting- ROS- full  review of systems was completed and negative  except for topics noted under acute/chronic concerns  The following were reviewed and entered/updated in epic: Past Medical History:  Diagnosis Date   Generalized anxiety disorder    GERD (gastroesophageal reflux disease)    Hypogonadism in male    in past - Managed by endo - Dr. Faythe. self weaned from meds and no signifcant change in symptoms.    Patient Active Problem List   Diagnosis Date Noted   Major depression in full remission (HCC) 02/12/2017    Priority: High   Hyperlipidemia 03/14/2014    Priority: Medium    OSA (obstructive sleep apnea) 11/08/2011    Priority: Medium    GAD (generalized anxiety disorder) 02/13/2011    Priority: Medium    Gastroesophageal reflux disease 02/13/2011    Priority: Medium    Hypogonadism male 02/13/2011    Priority: Medium    Chronic cough 06/29/2014    Priority: Low   MRSA infection 03/29/2011    Priority: Low   Past Surgical History:  Procedure Laterality Date   WISDOM TOOTH EXTRACTION      Family History  Problem Relation Age of Onset   Transient ischemic attack Mother    Hypertension Mother    Colon polyps Father    Arthritis Father        including knee replacement   Anxiety disorder Sister    Schizophrenia Brother        died 11 - suicide. diagnosis was unclear- brother would not get help   Dementia Maternal Grandfather    Cancer Paternal Grandmother        unknown= in bones   Emphysema Paternal Grandfather    Heart Problems Paternal Grandfather        pacemaker   Colon cancer Neg Hx    Esophageal cancer Neg Hx    Stomach cancer Neg Hx    Rectal cancer Neg Hx     Medications- reviewed and updated Current Outpatient Medications  Medication Sig Dispense Refill   cyanocobalamin  (VITAMIN B12) 1000 MCG tablet  Take 1,000 mcg by mouth daily.     escitalopram  (LEXAPRO ) 20 MG tablet TAKE 1 TABLET BY MOUTH EVERY DAY 90 tablet 4   omeprazole  (PRILOSEC) 20 MG capsule Take 20 mg by mouth daily.     No current facility-administered medications for this visit.    Allergies-reviewed and updated No Known Allergies  Social History   Social History Narrative   Married. 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 Oropharynx normal Neck: no thyromegaly CV: RRR no murmurs rubs or gallops Lungs: CTAB no crackles, wheeze, rhonchi Abdomen:  soft/nontender/nondistended/normal bowel sounds. No rebound or guarding.  Ext: no edema Skin: warm, dry Neuro: grossly normal, moves all extremities, PERRLA Declines genitourinary and rectal- no concerns    Assessment and Plan:  48 y.o. male presenting for annual physical.  Health Maintenance counseling: 1. Anticipatory guidance: Patient counseled regarding regular dental exams -q6 months, eye exams - no vision issues,  avoiding smoking and second hand smoke , limiting alcohol to 2 beverages per day-  maybe 2 a weekend, no illicit drugs.   2. Risk factor reduction:  Advised patient of need for regular exercise and diet rich and fruits and vegetables to reduce risk of heart attack and stroke.  Exercise-last year walking 5 days a week at least listening to podcast and walking dog and working well for him last year-  but now focused more on gym- elliptical and some strengthening 4-5 days a week Diet/weight management-down 3 lbs- watching portions mainly.  Long-term is wanted to get weight under 200.  His lowest to 10 in recent years Wt Readings from Last 3 Encounters:  12/22/23 219 lb 6.4 oz (99.5 kg)  11/29/22 222 lb 6.4 oz (100.9 kg)  01/28/22 210 lb (95.3 kg)  3. Immunizations/screenings/ancillary studies-declines COVID and flu shot  Immunization History  Administered Date(s) Administered   Tdap 10/31/2014  4. Prostate cancer screening- low risk initial PSA. No family history, start at age 27 or possibly 75 Lab Results  Component Value Date   PSA 0.21 06/25/2018   5. Colon cancer screening - 01/28/2022 with 10-year follow-up with Dr. Federico 6. Skin cancer screening/prevention-saw Dr. Alm in April. advised regular sunscreen use. Denies worrisome, changing, or new skin lesions.  7. Testicular cancer screening- advised monthly self exams  8. STD screening- patient opts out-only active with wife  9. Smoking associated screening-never smoker  Status of chronic or acute concerns   #hyperlipidemia S: Medication: none.  Arteriosclerotic cardiovascular disease risk usually under 5 % The 10-year ASCVD risk score (Arnett DK, et al., 2019) is: 4.3%. Mom with TIA but related to missing medications.  Lab Results  Component Value Date   CHOL 272 (H) 11/29/2022   HDL 32.30 (L) 11/29/2022   LDLCALC 215 (H) 11/29/2022   TRIG 123.0 11/29/2022   CHOLHDL 8 11/29/2022   A/P: update lipids- discussed considering statin if still over 190 with lifestyle changes  #OSA- compliant wth CPAP   # GERD S:Medication: Nexium  40Mg  then omeprazole  20 mg now OTC omeprazole  20 mg A/P: no isssues on medication(s). Check B12 with long term use. Doing B12 daily.    # Depression/GAD S: Medication: lexapro  20 mg in past -remains very helpful    12/22/2023    8:04 AM 11/29/2022    8:02 AM 11/26/2021    8:21 AM  Depression  screen PHQ 2/9  Decreased Interest 0 0 0  Down, Depressed, Hopeless 0 0 0  PHQ - 2 Score 0 0 0  Altered sleeping 0 1 0  Tired, decreased energy 0 1 0  Change in appetite 0 0 0  Feeling bad or failure about yourself  0 0 0  Trouble concentrating 0 0 0  Moving slowly or fidgety/restless 0 0 0  Suicidal thoughts 0 0 0  PHQ-9 Score 0 2 0  Difficult doing work/chores Not difficult at all Not difficult at all Not difficult at all  A/P: doing well continue current medications    # Neck and back tension-being Flexeril  is helpful particularly for sleep- has not needed lately. Exercises  have helped   # For folliculitis-treated by Dr. Alm with doxycycline  orally and clindamycin  topically-he reports didn't help as much as he would like   Recommended follow up: Return in about 1 year (around 12/21/2024) for physical or sooner if needed.Schedule b4 you leave. Future Appointments  Date Time Provider Department Center  08/09/2024  8:15 AM Alm Delon SAILOR, DO [facility] None   Lab/Order associations: fasting   ICD-10-CM   1. Preventative health care  Z00.00     2. Hyperlipidemia, unspecified hyperlipidemia type  E78.5 Lipid panel    CBC w/Diff    Comp Met (CMET)    3. GAD (generalized anxiety disorder)  F41.1     4. Gastroesophageal reflux disease without esophagitis  K21.9     5. Major depressive disorder in full remission, unspecified whether recurrent (HCC)  F32.5     6. High risk medication use  Z79.899 Vitamin B12      No orders of the defined types were placed in this encounter.   Return precautions advised.   Garnette Lukes, MD

## 2023-12-22 NOTE — Patient Instructions (Addendum)
 Please stop by lab before you go If you have mychart- we will send your results within 3 business days of us  receiving them.  If you do not have mychart- we will call you about results within 5 business days of us  receiving them.  *please also note that you will see labs on mychart as soon as they post. I will later go in and write notes on them- will say notes from Dr. Katrinka   Recommended follow up: Return in about 1 year (around 12/21/2024) for physical or sooner if needed.Schedule b4 you leave.

## 2024-01-05 NOTE — Telephone Encounter (Signed)
-----   Message from Garnette Lukes sent at 12/23/2023  8:58 PM EDT ----- Notes from Dr. Lukes:   Your cholesterol remains severely elevated with LDL at 211 with anything of 190 having a recommendation of starting at least rosuvastatin 20 mg daily #90 with 3 refills-team please send this in if he  agrees.  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

## 2024-01-05 NOTE — Telephone Encounter (Signed)
 Spoke with patient about cholesterol levels and notes per PCP. Pt wants to think about it before started medication recommended. Pt states will give call back to office with decision.

## 2024-01-21 DIAGNOSIS — M542 Cervicalgia: Secondary | ICD-10-CM | POA: Diagnosis not present

## 2024-01-21 DIAGNOSIS — M9901 Segmental and somatic dysfunction of cervical region: Secondary | ICD-10-CM | POA: Diagnosis not present

## 2024-01-21 DIAGNOSIS — M546 Pain in thoracic spine: Secondary | ICD-10-CM | POA: Diagnosis not present

## 2024-01-21 DIAGNOSIS — M9902 Segmental and somatic dysfunction of thoracic region: Secondary | ICD-10-CM | POA: Diagnosis not present

## 2024-02-18 ENCOUNTER — Encounter: Payer: Self-pay | Admitting: Family

## 2024-02-18 ENCOUNTER — Ambulatory Visit: Admitting: Family

## 2024-02-18 ENCOUNTER — Ambulatory Visit: Payer: Self-pay

## 2024-02-18 VITALS — BP 118/82 | HR 57 | Temp 97.4°F | Ht 73.0 in | Wt 220.2 lb

## 2024-02-18 DIAGNOSIS — M7918 Myalgia, other site: Secondary | ICD-10-CM | POA: Diagnosis not present

## 2024-02-18 NOTE — Telephone Encounter (Signed)
 FYI Only or Action Required?: FYI only for provider: appointment scheduled on 11/5.  Patient was last seen in primary care on 12/22/2023 by Katrinka Garnette KIDD, MD.  Called Nurse Triage reporting Chest Pain.  Symptoms began several weeks ago.  Interventions attempted: Nothing.  Symptoms are: gradually worsening.   Triage Disposition: See Physician Within 24 Hours  Patient/caregiver understands and will follow disposition?: Yes   Copied from CRM #[id number]. Topic: Clinical - Red Word Triage >> Feb 18, 2024  2:06 PM Thomas Duran wrote: Red Word that prompted transfer to Nurse Triage: Patient having dull pain in mid left side of chest and coughs up clear flem - requested appt Reason for Disposition  [1] Chest pain lasts > 5 minutes AND [2] occurred > 3 days ago (72 hours) AND [3] NO chest pain or cardiac symptoms now  Answer Assessment - Initial Assessment Questions 1. LOCATION: Where does it hurt?       Mid to left side of chest  2. RADIATION: Does the pain go anywhere else? (e.g., into neck, jaw, arms, back)     No  3. ONSET: When did the chest pain begin? (Minutes, hours or days)      Approx 2 weeks  4. PATTERN: Does the pain come and go, or has it been constant since it started?  Does it get worse with exertion?      Constant pain, hurts more when puffing out chest.   5. DURATION: How long does it last (e.g., seconds, minutes, hours)     Pain will last as long as he has chest puffed out  6. SEVERITY: How bad is the pain?  (e.g., Scale 1-10; mild, moderate, or severe)     4/10 dull pain  7. CARDIAC RISK FACTORS: Do you have any history of heart problems or risk factors for heart disease? (e.g., angina, prior heart attack; diabetes, high blood pressure, high cholesterol, smoker, or strong family history of heart disease)     Cholesterol was elevated at last visit,   8. PULMONARY RISK FACTORS: Do you have any history of lung disease?  (e.g., blood clots in lung,  asthma, emphysema, birth control pills)     No  9. CAUSE: What do you think is causing the chest pain?     Unsure of cause  10. OTHER SYMPTOMS: Do you have any other symptoms? (e.g., dizziness, nausea, vomiting, sweating, fever, difficulty breathing, cough)       Occasional cough within the last week  Protocols used: Chest Pain-A-AH

## 2024-02-18 NOTE — Progress Notes (Signed)
 Patient ID: HASSELL PATRAS, male    DOB: 1976/04/13, 48 y.o.   MRN: [id number]  Chief Complaint  Patient presents with   Chest Pain    Pt c/o dull pain and discomfort in chest, clearing throat a lot with mucus. Present for 2 weeks. Stretching make pain worse.   Discussed the use of AI scribe software for clinical note transcription with the patient, who gave verbal consent to proceed.  History of Present Illness Johnney Scarlata is a 48 year old male who presents with chest discomfort and occasional cough.  He experiences chest soreness for the past couple of weeks, particularly with certain movements, and a slight pressure sensation with deep breaths. He denies any trauma, exercise or any overuse activity to cause the pain.Occasionally, there is a 'cooling sensation' or 'prickling' in the chest area, but it is not tender to touch. Denies any tingling in chest or heavy pressure, no tightness or SOB. He has an occasional urge to clear his throat, resulting in sporadic coughing up of clear mucus. This throat clearing is more frequent than usual. He denies heartburn or burning pain and takes Prilosec daily. He experiences no tightness or shortness of breath during physical activities, such as using the elliptical machine at the gym, and has not noticed any worsening of symptoms with exercise. He is motivated to exercise and improve his diet to manage elevated cholesterol.  Assessment & Plan Musculoskeletal chest wall pain Intermittent chest pain likely musculoskeletal, no cardiac involvement signs. - Apply heat to the area for 20-30 minutes up to 3 times daily. - Take ibuprofen 600mg  2-3 times daily or 2 generic Aleve  twice a day. - Monitor symptoms and report if pain worsens or persists.  Hyperlipidemia Elevated cholesterol with 5.1% risk. Discussed coronary artery disease risk and cholesterol's role. Discuss coronary scan for calcified plaque assessment w/PCP. - Message Dr.  Carolee team about coronary scan possibility. - Continue lifestyle modifications: diet changes and cardio exercise.  Gastroesophageal reflux disease (GERD) Chronic GERD managed with omeprazole . Occasional cough possibly due to reflux. Posterior pharynx erythema noted. - Increase omeprazole  to twice daily for one week to see if helps symptoms, then reduce to daily use.   Subjective:    Outpatient Medications Prior to Visit  Medication Sig Dispense Refill   cyanocobalamin  (VITAMIN B12) 1000 MCG tablet Take 1,000 mcg by mouth daily.     escitalopram  (LEXAPRO ) 20 MG tablet TAKE 1 TABLET BY MOUTH EVERY DAY 90 tablet 4   omeprazole  (PRILOSEC) 20 MG capsule Take 20 mg by mouth daily.     No facility-administered medications prior to visit.   Past Medical History:  Diagnosis Date   Generalized anxiety disorder    GERD (gastroesophageal reflux disease)    Hypogonadism in male    in past - Managed by endo - Dr. Faythe. self weaned from meds and no signifcant change in symptoms.    Past Surgical History:  Procedure Laterality Date   WISDOM TOOTH EXTRACTION     No Known Allergies    Objective:    Physical Exam Vitals and nursing note reviewed.  Constitutional:      General: He is not in acute distress.    Appearance: Normal appearance.  HENT:     Head: Normocephalic.  Cardiovascular:     Rate and Rhythm: Normal rate and regular rhythm.  Pulmonary:     Effort: Pulmonary effort is normal.     Breath sounds: Normal breath sounds.  Chest:  Chest wall: No mass, swelling or tenderness.  Musculoskeletal:        General: Normal range of motion.     Cervical back: Normal range of motion.  Skin:    General: Skin is warm and dry.  Neurological:     Mental Status: He is alert and oriented to person, place, and time.  Psychiatric:        Mood and Affect: Mood normal.    BP 118/82 (BP Location: Left Arm, Patient Position: Sitting, Cuff Size: Large)   Pulse (!) 57   Temp (!) 97.4  F (36.3 C) (Temporal)   Ht 6' 1 (1.854 m)   Wt 220 lb 4 oz (99.9 kg)   SpO2 99%   BMI 29.06 kg/m  Wt Readings from Last 3 Encounters:  02/18/24 220 lb 4 oz (99.9 kg)  12/22/23 219 lb 6.4 oz (99.5 kg)  11/29/22 222 lb 6.4 oz (100.9 kg)     Lucius Krabbe, NP

## 2024-02-18 NOTE — Telephone Encounter (Signed)
 Patient scheduled to see Corean Comment today 02/18/2024. Dr. Katrinka will review triage notes.

## 2024-02-26 DIAGNOSIS — M9902 Segmental and somatic dysfunction of thoracic region: Secondary | ICD-10-CM | POA: Diagnosis not present

## 2024-02-26 DIAGNOSIS — M9901 Segmental and somatic dysfunction of cervical region: Secondary | ICD-10-CM | POA: Diagnosis not present

## 2024-02-26 DIAGNOSIS — M546 Pain in thoracic spine: Secondary | ICD-10-CM | POA: Diagnosis not present

## 2024-02-26 DIAGNOSIS — M542 Cervicalgia: Secondary | ICD-10-CM | POA: Diagnosis not present

## 2024-03-30 DIAGNOSIS — M9902 Segmental and somatic dysfunction of thoracic region: Secondary | ICD-10-CM | POA: Diagnosis not present

## 2024-03-30 DIAGNOSIS — M9901 Segmental and somatic dysfunction of cervical region: Secondary | ICD-10-CM | POA: Diagnosis not present

## 2024-03-30 DIAGNOSIS — M546 Pain in thoracic spine: Secondary | ICD-10-CM | POA: Diagnosis not present

## 2024-03-30 DIAGNOSIS — M542 Cervicalgia: Secondary | ICD-10-CM | POA: Diagnosis not present

## 2024-05-05 ENCOUNTER — Other Ambulatory Visit: Payer: Self-pay | Admitting: Family Medicine

## 2024-08-09 ENCOUNTER — Ambulatory Visit: Admitting: Dermatology

## 2024-12-24 ENCOUNTER — Encounter: Admitting: Family Medicine
